# Patient Record
Sex: Female | Born: 1980 | Hispanic: No | Marital: Single | Smoking: Never smoker
Health system: Southern US, Community
[De-identification: ages and names within clinical notes are randomized; demographics above are authoritative.]

## PROBLEM LIST (undated history)

## (undated) ENCOUNTER — Inpatient Hospital Stay (HOSPITAL_COMMUNITY): Payer: Self-pay

## (undated) DIAGNOSIS — R87629 Unspecified abnormal cytological findings in specimens from vagina: Secondary | ICD-10-CM

## (undated) DIAGNOSIS — O480 Post-term pregnancy: Principal | ICD-10-CM

## (undated) DIAGNOSIS — Z98891 History of uterine scar from previous surgery: Secondary | ICD-10-CM

## (undated) DIAGNOSIS — O1495 Unspecified pre-eclampsia, complicating the puerperium: Secondary | ICD-10-CM

## (undated) DIAGNOSIS — Z789 Other specified health status: Secondary | ICD-10-CM

## (undated) HISTORY — DX: Unspecified abnormal cytological findings in specimens from vagina: R87.629

## (undated) HISTORY — PX: DILATION AND CURETTAGE OF UTERUS: SHX78

## (undated) HISTORY — PX: CERVICAL BIOPSY: SHX590

---

## 2007-11-16 ENCOUNTER — Other Ambulatory Visit (HOSPITAL_COMMUNITY): Admission: RE | Admit: 2007-11-16 | Discharge: 2007-12-01 | Payer: Self-pay | Admitting: Psychiatry

## 2007-11-17 ENCOUNTER — Ambulatory Visit: Payer: Self-pay | Admitting: Psychiatry

## 2011-01-19 ENCOUNTER — Emergency Department (HOSPITAL_COMMUNITY)
Admission: EM | Admit: 2011-01-19 | Discharge: 2011-01-20 | Disposition: A | Discharge status: Home / Self Care | Payer: Self-pay | Attending: Emergency Medicine | Admitting: Emergency Medicine

## 2011-01-19 DIAGNOSIS — R21 Rash and other nonspecific skin eruption: Secondary | ICD-10-CM | POA: Insufficient documentation

## 2011-01-19 DIAGNOSIS — I10 Essential (primary) hypertension: Secondary | ICD-10-CM | POA: Insufficient documentation

## 2011-01-19 DIAGNOSIS — L2989 Other pruritus: Secondary | ICD-10-CM | POA: Insufficient documentation

## 2011-01-19 DIAGNOSIS — L298 Other pruritus: Secondary | ICD-10-CM | POA: Insufficient documentation

## 2011-01-19 DIAGNOSIS — L509 Urticaria, unspecified: Secondary | ICD-10-CM | POA: Insufficient documentation

## 2011-01-19 DIAGNOSIS — T7840XA Allergy, unspecified, initial encounter: Secondary | ICD-10-CM | POA: Insufficient documentation

## 2011-06-08 LAB — URINE DRUGS OF ABUSE SCREEN W ALC, ROUTINE (REF LAB)
Barbiturate Quant, Ur: NEGATIVE
Benzodiazepines.: NEGATIVE
Methadone: NEGATIVE
Phencyclidine (PCP): NEGATIVE

## 2015-03-25 ENCOUNTER — Inpatient Hospital Stay (HOSPITAL_COMMUNITY)
Admission: EM | Admit: 2015-03-25 | Discharge: 2015-03-25 | Disposition: A | Discharge status: Home / Self Care | Payer: Medicaid Other | Source: Ambulatory Visit | Attending: Obstetrics & Gynecology | Admitting: Obstetrics & Gynecology

## 2015-03-25 ENCOUNTER — Inpatient Hospital Stay (HOSPITAL_COMMUNITY): Payer: Medicaid Other

## 2015-03-25 ENCOUNTER — Encounter (HOSPITAL_COMMUNITY): Payer: Self-pay | Admitting: *Deleted

## 2015-03-25 DIAGNOSIS — O020 Blighted ovum and nonhydatidiform mole: Secondary | ICD-10-CM | POA: Insufficient documentation

## 2015-03-25 DIAGNOSIS — R1030 Lower abdominal pain, unspecified: Secondary | ICD-10-CM | POA: Diagnosis present

## 2015-03-25 DIAGNOSIS — N760 Acute vaginitis: Secondary | ICD-10-CM

## 2015-03-25 DIAGNOSIS — Z3A14 14 weeks gestation of pregnancy: Secondary | ICD-10-CM | POA: Diagnosis not present

## 2015-03-25 DIAGNOSIS — O080 Genital tract and pelvic infection following ectopic and molar pregnancy: Secondary | ICD-10-CM | POA: Insufficient documentation

## 2015-03-25 DIAGNOSIS — O209 Hemorrhage in early pregnancy, unspecified: Secondary | ICD-10-CM

## 2015-03-25 DIAGNOSIS — O26899 Other specified pregnancy related conditions, unspecified trimester: Secondary | ICD-10-CM

## 2015-03-25 DIAGNOSIS — R109 Unspecified abdominal pain: Secondary | ICD-10-CM

## 2015-03-25 DIAGNOSIS — B9689 Other specified bacterial agents as the cause of diseases classified elsewhere: Secondary | ICD-10-CM

## 2015-03-25 HISTORY — DX: Other specified health status: Z78.9

## 2015-03-25 LAB — URINALYSIS, ROUTINE W REFLEX MICROSCOPIC
Bilirubin Urine: NEGATIVE
Glucose, UA: NEGATIVE mg/dL
Ketones, ur: NEGATIVE mg/dL
LEUKOCYTES UA: NEGATIVE
NITRITE: NEGATIVE
PH: 5.5 (ref 5.0–8.0)
PROTEIN: NEGATIVE mg/dL
SPECIFIC GRAVITY, URINE: 1.025 (ref 1.005–1.030)
Urobilinogen, UA: 0.2 mg/dL (ref 0.0–1.0)

## 2015-03-25 LAB — CBC
HCT: 38.2 % (ref 36.0–46.0)
Hemoglobin: 12.7 g/dL (ref 12.0–15.0)
MCH: 29.7 pg (ref 26.0–34.0)
MCHC: 33.2 g/dL (ref 30.0–36.0)
MCV: 89.3 fL (ref 78.0–100.0)
PLATELETS: 262 10*3/uL (ref 150–400)
RBC: 4.28 MIL/uL (ref 3.87–5.11)
RDW: 13.6 % (ref 11.5–15.5)
WBC: 7.2 10*3/uL (ref 4.0–10.5)

## 2015-03-25 LAB — WET PREP, GENITAL
TRICH WET PREP: NONE SEEN
YEAST WET PREP: NONE SEEN

## 2015-03-25 LAB — URINE MICROSCOPIC-ADD ON

## 2015-03-25 LAB — POCT PREGNANCY, URINE: PREG TEST UR: POSITIVE — AB

## 2015-03-25 LAB — ABO/RH: ABO/RH(D): B POS

## 2015-03-25 LAB — HCG, QUANTITATIVE, PREGNANCY: hCG, Beta Chain, Quant, S: 3368 m[IU]/mL — ABNORMAL HIGH (ref <5)

## 2015-03-25 MED ORDER — MISOPROSTOL 200 MCG PO TABS: 800.0000 ug | ORAL_TABLET | Freq: Once | ORAL | Status: DC

## 2015-03-25 MED ORDER — IBUPROFEN 600 MG PO TABS: 600.0000 mg | ORAL_TABLET | Freq: Four times a day (QID) | ORAL | Status: DC | PRN

## 2015-03-25 MED ORDER — CLINDAMYCIN HCL 300 MG PO CAPS
300.0000 mg | ORAL_CAPSULE | Freq: Two times a day (BID) | ORAL | Status: DC
Start: 2015-03-25 — End: 2016-12-22

## 2015-03-25 MED ORDER — PROMETHAZINE HCL 12.5 MG PO TABS: 12.5000 mg | ORAL_TABLET | Freq: Four times a day (QID) | ORAL | Status: DC | PRN

## 2015-03-25 MED ORDER — IBUPROFEN 600 MG PO TABS
600.0000 mg | ORAL_TABLET | Freq: Once | ORAL | Status: AC
  Administered 2015-03-25: 600 mg via ORAL
  Filled 2015-03-25: qty 1

## 2015-03-25 MED ORDER — HYDROCODONE-ACETAMINOPHEN 5-325 MG PO TABS: 1.0000 | ORAL_TABLET | ORAL | Status: DC | PRN

## 2015-03-25 NOTE — Discharge Instructions (Signed)
Blighted Ovum A blighted ovum (anembryonic pregnancy) happens when a fertilized egg (embryo) attaches itself to the uterine wall, but the embryo does not develop. The pregnancy sac (placenta) continues to grow even though the embryo does not grow and develop.The pregnancy hormone is still secreted because the placenta has formed. This will result in a positive pregnancy test despite having an abnormal pregnancy. A blighted ovum occurs within the first trimester, sometimes before a woman knows she is pregnant.  CAUSES A blighted ovum is usually the result of chromosomal problems. This can be caused by abnormal cell division or poor quality sperm or egg. SYMPTOMS Early on, signs of pregnancy may be experienced, such as:  A missed menstrual period.  Fatigue.  Feeling sick to your stomach (nauseous).  Sore breasts.  A positive pregnancy test. Then, signs of miscarriage may develop, such as:  Abdominal cramps.  Vaginal bleeding or spotting.  A menstrual period that is heavier than usual. DIAGNOSIS The diagnosis of a blighted ovum is made with an ultrasound test that shows an empty uterus or an empty gestational sac. TREATMENT Your caregiver will help you decide what the best treatment is for you. Treatment for a blighted ovum includes:   Letting your body naturally pass the tissue of a blighted ovum.  Taking medicine to trigger the miscarriage.  Having a procedure called a dilation and curettage (D&C) to remove the placental tissues.  A D&C may be helpful if you would like the tissue examined to determine the reason for a miscarriage. Talk to your caregiver about the risks involved with this procedure. HOME CARE INSTRUCTIONS   Follow up with your caregiver to make sure that your pregnancy hormone returns to zero.  Wait at least 1 to 3 regular menstrual cycles before trying to get pregnant again, or as recommended by your caregiver. SEEK IMMEDIATE MEDICAL CARE IF:  You have  worsening abdominal pain.  You have very heavy bleeding or use 1 to 2 pads every hour, for more than 2 hours.  You are dizzy, feel faint, or pass out. Document Released: 12/16/2010 Document Revised: 11/23/2011 Document Reviewed: 12/16/2010 ExitCare Patient Information 2015 [employer]. This information is not intended to replace advice given to you by your health care provider. Make sure you discuss any questions you have with your health care provider.  

## 2015-03-25 NOTE — MAU Provider Note (Signed)
History     CSN: [id number]  Arrival date and time: 03/25/15 1024   First Provider Initiated Contact with Patient 03/25/15 1115      Chief Complaint  Patient presents with  . Vaginal Bleeding  . Abdominal Pain   HPI    34 yo AA female is a G3P0020 with an EDC 09/21/2015 at 14wk2d gestation determined by LMP who presents with a 1 day history of bleeding after using the restroom and lower abdominal pain. Patient first noticed blood on the toilet paper after urinating yesterday and "wiping in the front". Later on that night she had a bowel movement and noticed blood in the toilet and on the toilet paper after "wiping in the back." Blood is thin and light pink in color without noticeable clots and "smells like blood, like after starting a period." Has felt constipated for the last 2-3 days and has strained with bowel movements.   Has 8/10 crampy left-sided lower back pain and 5/10 crampy lower abdominal pain. Pain comes and goes-worsened when laying down to sleep and not worsened with bowel movements. Has taken no medicines. No change in appetite, nausea, vomiting, dysuria or polyuria. No history of UTIs.   Had a cold last week with minor congestion and cough that is resolving.   LMP 12/18/2014. Had 1 day of vaginal bleeding with dark red blood on 01/26/2015 that resolved. No previous problems with pregnancy besides occasional minor nausea and dizziness. Prenatal care to be done at [facility]. Pregnancy is desired.    OB History    Gravida Para Term Preterm AB TAB SAB Ectopic Multiple Living   3    2 2     0      Past Medical History  Diagnosis Date  . Medical history non-contributory     Past Surgical History  Procedure Laterality Date  . Dilation and curettage of uterus    . Cervical biopsy      History reviewed. No pertinent family history.  History  Substance Use Topics  . Smoking status: Never Smoker   . Smokeless tobacco: Not on file  . Alcohol Use: Yes      Comment: socially    Allergies:  Allergies  Allergen Reactions  . Metronidazole Hives    No prescriptions prior to admission    Review of Systems  Constitutional: Negative for fever and chills.  HENT: Negative for congestion and sore throat.   Respiratory: Negative for cough and shortness of breath.   Cardiovascular: Negative for chest pain and leg swelling.  Gastrointestinal: Positive for abdominal pain, constipation and blood in stool. Negative for nausea, vomiting and diarrhea.  Genitourinary: Positive for hematuria (on toilet paper). Negative for dysuria, urgency and frequency.  Musculoskeletal: Positive for back pain (left lower back).  Skin: Positive for rash ("broke out on my face, but it's getting better").  Neurological: Negative for dizziness, loss of consciousness and headaches.   Physical Exam   Blood pressure 112/73, pulse 83, temperature 98.4 F (36.9 C), temperature source Oral, resp. rate 16, height 5\' 2"  (1.575 m), weight 81.285 kg (179 lb 3.2 oz), last menstrual period 12/15/2014.  Physical Exam  Constitutional: She is oriented to person, place, and time. She appears well-developed and well-nourished.  HENT:  Head: Normocephalic and atraumatic.  Cardiovascular: Normal rate, regular rhythm and normal heart sounds.   Respiratory: Effort normal and breath sounds normal.  GI: Soft. Bowel sounds are normal. She exhibits no distension. There is no tenderness.  Genitourinary:  Speculum  exam: Vagina - small-moderate amount of dark red blood noted in the vaginal canal. One fox swab used to clear.  Cervix -+ active bleeding, small clot at the cervix.  Bimanual exam: Cervix slightly open  Uterus non tender, enlarged  Adnexa non tender, no masses bilaterally GC/Chlam, wet prep done Chaperone present for exam.  Neurological: She is alert and oriented to person, place, and time.  Skin: Skin is warm and dry.  Psychiatric: She has a normal mood and affect.   Results  for orders placed or performed during the hospital encounter of 03/25/15 (from the past 24 hour(s))  Urinalysis, Routine w reflex microscopic (not at [facility])     Status: Abnormal   Collection Time: 03/25/15 10:42 AM  Result Value Ref Range   Color, Urine YELLOW YELLOW   APPearance CLEAR CLEAR   Specific Gravity, Urine 1.025 1.005 - 1.030   pH 5.5 5.0 - 8.0   Glucose, UA NEGATIVE NEGATIVE mg/dL   Hgb urine dipstick LARGE (A) NEGATIVE   Bilirubin Urine NEGATIVE NEGATIVE   Ketones, ur NEGATIVE NEGATIVE mg/dL   Protein, ur NEGATIVE NEGATIVE mg/dL   Urobilinogen, UA 0.2 0.0 - 1.0 mg/dL   Nitrite NEGATIVE NEGATIVE   Leukocytes, UA NEGATIVE NEGATIVE  Urine microscopic-add on     Status: Abnormal   Collection Time: 03/25/15 10:42 AM  Result Value Ref Range   Squamous Epithelial / LPF FEW (A) RARE   RBC / HPF 0-2 <3 RBC/hpf   Bacteria, UA RARE RARE  Pregnancy, urine POC     Status: Abnormal   Collection Time: 03/25/15 10:49 AM  Result Value Ref Range   Preg Test, Ur POSITIVE (A) NEGATIVE  CBC     Status: None   Collection Time: 03/25/15 11:50 AM  Result Value Ref Range   WBC 7.2 4.0 - 10.5 K/uL   RBC 4.28 3.87 - 5.11 MIL/uL   Hemoglobin 12.7 12.0 - 15.0 g/dL   HCT 38.2 36.0 - 46.0 %   MCV 89.3 78.0 - 100.0 fL   MCH 29.7 26.0 - 34.0 pg   MCHC 33.2 30.0 - 36.0 g/dL   RDW 13.6 11.5 - 15.5 %   Platelets 262 150 - 400 K/uL  ABO/Rh     Status: None (Preliminary result)   Collection Time: 03/25/15 11:50 AM  Result Value Ref Range   ABO/RH(D) B POS   hCG, quantitative, pregnancy     Status: Abnormal   Collection Time: 03/25/15 11:50 AM  Result Value Ref Range   hCG, Beta Chain, Quant, S 3368 (H) <5 mIU/mL  Wet prep, genital     Status: Abnormal   Collection Time: 03/25/15 12:59 PM  Result Value Ref Range   Yeast Wet Prep HPF POC NONE SEEN NONE SEEN   Trich, Wet Prep NONE SEEN NONE SEEN   Clue Cells Wet Prep HPF POC MODERATE (A) NONE SEEN   WBC, Wet Prep HPF POC FEW (A) NONE SEEN      US Ob Comp Less 14 Wks  03/25/2015   CLINICAL DATA:  Abdominal pain and cramping.  EXAM: OBSTETRIC <14 WK [place] AND TRANSVAGINAL OB US  TECHNIQUE: Both transabdominal and transvaginal ultrasound examinations were performed for complete evaluation of the gestation as well as the maternal uterus, adnexal regions, and pelvic cul-de-sac. Transvaginal technique was performed to assess early pregnancy.  COMPARISON:  None.  FINDINGS: Intrauterine gestational sac: Single  Yolk sac:  No  Embryo:  No  Cardiac Activity: Not applicable  Heart Rate:  Not applicable  bpm  MSD: 94.5  mm   8 w   4  d  Maternal uterus/adnexae:  Subchorionic hemorrhage: None  Right ovary: Normal  Left ovary: Normal  Other :None  Free fluid:  None  IMPRESSION: 1. 31.9 mm in mean sac diameter corresponds to an 8 week 4 day gestational. No embryo or yolk sac is identified. Findings meet definitive criteria for failed pregnancy. This follows SRU consensus guidelines: Diagnostic Criteria for Nonviable Pregnancy Early in the First Trimester. Alison Stalling J Med [phone].   Electronically Signed   By: Kerby Moors M.D.   On: 03/25/2015 12:54   US Ob Transvaginal  03/25/2015   CLINICAL DATA:  Abdominal pain and cramping.  EXAM: OBSTETRIC <14 WK [place] AND TRANSVAGINAL OB US  TECHNIQUE: Both transabdominal and transvaginal ultrasound examinations were performed for complete evaluation of the gestation as well as the maternal uterus, adnexal regions, and pelvic cul-de-sac. Transvaginal technique was performed to assess early pregnancy.  COMPARISON:  None.  FINDINGS: Intrauterine gestational sac: Single  Yolk sac:  No  Embryo:  No  Cardiac Activity: Not applicable  Heart Rate: Not applicable  bpm  MSD: 79.1  mm   8 w   4  d  Maternal uterus/adnexae:  Subchorionic hemorrhage: None  Right ovary: Normal  Left ovary: Normal  Other :None  Free fluid:  None  IMPRESSION: 1. 31.9 mm in mean sac diameter corresponds to an 8 week 4 day gestational. No embryo or  yolk sac is identified. Findings meet definitive criteria for failed pregnancy. This follows SRU consensus guidelines: Diagnostic Criteria for Nonviable Pregnancy Early in the First Trimester. Alison Stalling J Med [phone].   Electronically Signed   By: Kerby Moors M.D.   On: 03/25/2015 12:54      MAU Course  Procedures  None  MDM -Doppler: unable to auscultate fetal heart tones -UPT positive with large Hgb on urine dipstick and abdominal pain: order CBC, ABO Rh, hCG quant, GC, chlamydia, wet prep, HIV, ultrasound -B positive blood type  -Discussed expectant management, cytotec, D&C. Patient would like to proceed with cytotec. Discussed administration at home/ precautions at length.    Early Intrauterine Pregnancy Failure Protocol X  Documented intrauterine pregnancy failure less than or equal to [redacted] weeks gestation  X  No serious current illness  X  Baseline Hgb greater than or equal to 10g/dl  X  Patient has easily accessible transportation to the hospital  X  Clear preference  X  Practitioner/physician deems patient reliable  X  Counseling by practitioner or physician  X  Patient education by RN  X  Consent form signed   NA    Rho-Gam given by RN if indicated  X  Medication dispensed  X  Cytotec 800 mcg Intravaginally by patient at home with 1 refill to repeat in 48 hours if no onset of heavy bleeding.       Intravaginally by NP in MAU       Rectally by patient at home       Rectally by RN in MAU  X   Ibuprofen 600 mg 1 tablet by mouth every 6 hours as needed #30 - prescribed  X   Vicodin mg by mouth every 4 to 6 hours as needed - #10 prescribed  X   Phenergan 12.5 mg by mouth every 4 hours as needed for nausea - #10 prescribed  Reviewed with pt cytotec procedure.  Pt verbalizes that she lives close  to the hospital and has transportation readily available.  Pt appears reliable and verbalizes understanding and agrees with plan of care  Assessment and Plan    A:  1.  Blighted ovum   2. Abdominal pain in pregnancy, antepartum   3. Vaginal bleeding in pregnancy, first trimester   4. BV (bacterial vaginosis)     P:  Discharge home in stable condition Bleeding precautions Return to MAU if symptoms worsen RX: vicodin, ibuprofen, cytotec with 1 refill, phenergan, clindamycin (patient allergic to metronidazole)  Follow up in the [place] in 1-2 weeks for repeat beta Pelvic rest Support given  Lezlie Lye, NP 03/25/2015 1:59 PM

## 2015-03-25 NOTE — MAU Note (Signed)
Saw blood with wiping yesterday, blood in toilet & on tissue this a.m.  Lower abd cramping & back pain.  [facility].  2 pos HPT's, one a month ago, one last week.

## 2015-03-26 LAB — GC/CHLAMYDIA PROBE AMP (~~LOC~~) NOT AT ARMC
Chlamydia: NEGATIVE
NEISSERIA GONORRHEA: NEGATIVE

## 2015-03-26 LAB — HIV ANTIBODY (ROUTINE TESTING W REFLEX): HIV SCREEN 4TH GENERATION: NONREACTIVE

## 2015-04-08 ENCOUNTER — Other Ambulatory Visit: Payer: Self-pay

## 2015-04-11 ENCOUNTER — Other Ambulatory Visit: Payer: Medicaid Other

## 2015-04-11 DIAGNOSIS — O039 Complete or unspecified spontaneous abortion without complication: Secondary | ICD-10-CM

## 2015-04-12 LAB — HCG, QUANTITATIVE, PREGNANCY: HCG, BETA CHAIN, QUANT, S: 13.2 m[IU]/mL

## 2015-04-15 ENCOUNTER — Encounter: Payer: Self-pay | Admitting: *Deleted

## 2015-04-15 ENCOUNTER — Telehealth: Payer: Self-pay | Admitting: *Deleted

## 2015-04-15 NOTE — Telephone Encounter (Signed)
Attempted to contact patient, no answer, will send certified letter.  Certified letter sent.

## 2015-04-15 NOTE — Telephone Encounter (Signed)
-----   Message from Woodroe Mode, MD sent at 04/14/2015  3:45 PM EDT ----- Drop in HCG c/w complete miscarriage

## 2015-11-18 ENCOUNTER — Ambulatory Visit (INDEPENDENT_AMBULATORY_CARE_PROVIDER_SITE_OTHER): Payer: Medicaid Other | Admitting: Obstetrics and Gynecology

## 2015-11-18 ENCOUNTER — Other Ambulatory Visit (HOSPITAL_COMMUNITY)
Admission: RE | Admit: 2015-11-18 | Discharge: 2015-11-18 | Disposition: A | Discharge status: Home / Self Care | Payer: Medicaid Other | Source: Ambulatory Visit | Attending: Obstetrics and Gynecology | Admitting: Obstetrics and Gynecology

## 2015-11-18 ENCOUNTER — Encounter: Payer: Self-pay | Admitting: Obstetrics and Gynecology

## 2015-11-18 VITALS — BP 115/63 | HR 71 | Temp 98.5°F | Wt 174.1 lb

## 2015-11-18 DIAGNOSIS — Z124 Encounter for screening for malignant neoplasm of cervix: Secondary | ICD-10-CM

## 2015-11-18 DIAGNOSIS — Z1151 Encounter for screening for human papillomavirus (HPV): Secondary | ICD-10-CM | POA: Diagnosis not present

## 2015-11-18 DIAGNOSIS — Z01419 Encounter for gynecological examination (general) (routine) without abnormal findings: Secondary | ICD-10-CM | POA: Diagnosis not present

## 2015-11-18 DIAGNOSIS — Z0000 Encounter for general adult medical examination without abnormal findings: Secondary | ICD-10-CM | POA: Diagnosis not present

## 2015-11-18 DIAGNOSIS — Z3202 Encounter for pregnancy test, result negative: Secondary | ICD-10-CM | POA: Diagnosis not present

## 2015-11-18 DIAGNOSIS — N926 Irregular menstruation, unspecified: Secondary | ICD-10-CM

## 2015-11-18 LAB — POCT PREGNANCY, URINE: PREG TEST UR: NEGATIVE

## 2015-11-18 MED ORDER — PRENATAL PLUS 27-1 MG PO TABS: 1.0000 | ORAL_TABLET | Freq: Every day | ORAL | Status: DC

## 2015-11-18 NOTE — Progress Notes (Addendum)
Subjective:     Patient ID: Crystal Hudson, female   DOB: September 02, 1981, 35 y.o.   MRN: [id number]  HPI G4 P0020 here for well-woman exam and desire to conceive. Had  Early EAB x2 (1 with D&E)  followed by cytotec for EPF in July 2016. Began trying to conceive in October. Ms. Hx [phone]. LNMP Dec 24-27. Short duration cycle Jan 30-31, light flow. Also started working FT in Jan. Gets primary care at [facility]. History of abnormal Pap in the  past with follow-ups normal. Last was 2 years ago.    Review of Systems Concern with R>L shoulder discomfort and pain especially with exercise attributed to her large breasts. Has tried specially fitted bras and sports bras with little relief. ROS otherwise negative.     Past Medical History  Diagnosis Date  . Medical history non-contributory    Past Surgical History  Procedure Laterality Date  . Dilation and curettage of uterus    . Cervical biopsy        Objective:   Physical Exam  Constitutional: She is oriented to person, place, and time. She appears well-developed and well-nourished.  HENT:  Head: Normocephalic.  Eyes: Pupils are equal, round, and reactive to light.  Neck: Normal range of motion. No thyromegaly present.  Cardiovascular: Normal rate and regular rhythm.   Pulmonary/Chest: Effort normal and breath sounds normal.  Abdominal: Soft. There is no tenderness.  Genitourinary: Vagina normal and uterus normal. No vaginal discharge found.  Musculoskeletal: Normal range of motion.  Neurological: She is alert and oriented to person, place, and time.  Skin: Skin is warm and dry.  Nursing note and vitals reviewed. Breasts are pendulous      Assessment:     1. Well woman exam with routine gynecological exam   2. Irregular menses       Plan:   Pap and HIV sent Advised trying different supportive bras and avoid exercises causing discomfort Reassurance that single abnormal menstrual period not significant Counseled: healthy diet May  try basal body temperature regiment if desires Repeat HPT 1 week idf still late menses  Rx PNVs 1/d  Return prn

## 2015-11-18 NOTE — Patient Instructions (Signed)
Health Maintenance, Female Adopting a healthy lifestyle and getting preventive care can go a long way to promote health and wellness. Talk with your health care provider about what schedule of regular examinations is right for you. This is a good chance for you to check in with your provider about disease prevention and staying healthy. In between checkups, there are plenty of things you can do on your own. Experts have done a lot of research about which lifestyle changes and preventive measures are most likely to keep you healthy. Ask your health care provider for more information. WEIGHT AND DIET  Eat a healthy diet  Be sure to include plenty of vegetables, fruits, low-fat dairy products, and lean protein.  Do not eat a lot of foods high in solid fats, added sugars, or salt.  Get regular exercise. This is one of the most important things you can do for your health.  Most adults should exercise for at least 150 minutes each week. The exercise should increase your heart rate and make you sweat (moderate-intensity exercise).  Most adults should also do strengthening exercises at least twice a week. This is in addition to the moderate-intensity exercise.  Maintain a healthy weight  Body mass index (BMI) is a measurement that can be used to identify possible weight problems. It estimates body fat based on height and weight. Your health care provider can help determine your BMI and help you achieve or maintain a healthy weight.  For females 20 years of age and older:   A BMI below 18.5 is considered underweight.  A BMI of 18.5 to 24.9 is normal.  A BMI of 25 to 29.9 is considered overweight.  A BMI of 30 and above is considered obese.  Watch levels of cholesterol and blood lipids  You should start having your blood tested for lipids and cholesterol at 34 years of age, then have this test every 5 years.  You may need to have your cholesterol levels checked more often if:  Your lipid  or cholesterol levels are high.  You are older than 34 years of age.  You are at high risk for heart disease.  CANCER SCREENING   Lung Cancer  Lung cancer screening is recommended for adults 55-80 years old who are at high risk for lung cancer because of a history of smoking.  A yearly low-dose CT scan of the lungs is recommended for people who:  Currently smoke.  Have quit within the past 15 years.  Have at least a 30-pack-year history of smoking. A pack year is smoking an average of one pack of cigarettes a day for 1 year.  Yearly screening should continue until it has been 15 years since you quit.  Yearly screening should stop if you develop a health problem that would prevent you from having lung cancer treatment.  Breast Cancer  Practice breast self-awareness. This means understanding how your breasts normally appear and feel.  It also means doing regular breast self-exams. Let your health care provider know about any changes, no matter how small.  If you are in your 20s or 30s, you should have a clinical breast exam (CBE) by a health care provider every 1-3 years as part of a regular health exam.  If you are 40 or older, have a CBE every year. Also consider having a breast X-ray (mammogram) every year.  If you have a family history of breast cancer, talk to your health care provider about genetic screening.  If you   are at high risk for breast cancer, talk to your health care provider about having an MRI and a mammogram every year.  Breast cancer gene (BRCA) assessment is recommended for women who have family members with BRCA-related cancers. BRCA-related cancers include:  Breast.  Ovarian.  Tubal.  Peritoneal cancers.  Results of the assessment will determine the need for genetic counseling and BRCA1 and BRCA2 testing. Cervical Cancer Your health care provider may recommend that you be screened regularly for cancer of the pelvic organs (ovaries, uterus, and  vagina). This screening involves a pelvic examination, including checking for microscopic changes to the surface of your cervix (Pap test). You may be encouraged to have this screening done every 3 years, beginning at age 21.  For women ages 30-65, health care providers may recommend pelvic exams and Pap testing every 3 years, or they may recommend the Pap and pelvic exam, combined with testing for human papilloma virus (HPV), every 5 years. Some types of HPV increase your risk of cervical cancer. Testing for HPV may also be done on women of any age with unclear Pap test results.  Other health care providers may not recommend any screening for nonpregnant women who are considered low risk for pelvic cancer and who do not have symptoms. Ask your health care provider if a screening pelvic exam is right for you.  If you have had past treatment for cervical cancer or a condition that could lead to cancer, you need Pap tests and screening for cancer for at least 20 years after your treatment. If Pap tests have been discontinued, your risk factors (such as having a new sexual partner) need to be reassessed to determine if screening should resume. Some women have medical problems that increase the chance of getting cervical cancer. In these cases, your health care provider may recommend more frequent screening and Pap tests. Colorectal Cancer  This type of cancer can be detected and often prevented.  Routine colorectal cancer screening usually begins at 34 years of age and continues through 34 years of age.  Your health care provider may recommend screening at an earlier age if you have risk factors for colon cancer.  Your health care provider may also recommend using home test kits to check for hidden blood in the stool.  A small camera at the end of a tube can be used to examine your colon directly (sigmoidoscopy or colonoscopy). This is done to check for the earliest forms of colorectal  cancer.  Routine screening usually begins at age 50.  Direct examination of the colon should be repeated every 5-10 years through 34 years of age. However, you may need to be screened more often if early forms of precancerous polyps or small growths are found. Skin Cancer  Check your skin from head to toe regularly.  Tell your health care provider about any new moles or changes in moles, especially if there is a change in a mole's shape or color.  Also tell your health care provider if you have a mole that is larger than the size of a pencil eraser.  Always use sunscreen. Apply sunscreen liberally and repeatedly throughout the day.  Protect yourself by wearing long sleeves, pants, a wide-brimmed hat, and sunglasses whenever you are outside. HEART DISEASE, DIABETES, AND HIGH BLOOD PRESSURE   High blood pressure causes heart disease and increases the risk of stroke. High blood pressure is more likely to develop in:  People who have blood pressure in the high end   of the normal range (130-139/85-89 mm Hg).  People who are overweight or obese.  People who are African American.  If you are 38-23 years of age, have your blood pressure checked every 3-5 years. If you are 61 years of age or older, have your blood pressure checked every year. You should have your blood pressure measured twice--once when you are at a hospital or clinic, and once when you are not at a hospital or clinic. Record the average of the two measurements. To check your blood pressure when you are not at a hospital or clinic, you can use:  An automated blood pressure machine at a pharmacy.  A home blood pressure monitor.  If you are between 45 years and 39 years old, ask your health care provider if you should take aspirin to prevent strokes.  Have regular diabetes screenings. This involves taking a blood sample to check your fasting blood sugar level.  If you are at a normal weight and have a low risk for diabetes,  have this test once every three years after 34 years of age.  If you are overweight and have a high risk for diabetes, consider being tested at a younger age or more often. PREVENTING INFECTION  Hepatitis B  If you have a higher risk for hepatitis B, you should be screened for this virus. You are considered at high risk for hepatitis B if:  You were born in a country where hepatitis B is common. Ask your health care provider which countries are considered high risk.  Your parents were born in a high-risk country, and you have not been immunized against hepatitis B (hepatitis B vaccine).  You have HIV or AIDS.  You use needles to inject street drugs.  You live with someone who has hepatitis B.  You have had sex with someone who has hepatitis B.  You get hemodialysis treatment.  You take certain medicines for conditions, including cancer, organ transplantation, and autoimmune conditions. Hepatitis C  Blood testing is recommended for:  Everyone born from [birth year] through 1964.  Anyone with known risk factors for hepatitis C. Sexually transmitted infections (STIs)  You should be screened for sexually transmitted infections (STIs) including gonorrhea and chlamydia if:  You are sexually active and are younger than 34 years of age.  You are older than 34 years of age and your health care provider tells you that you are at risk for this type of infection.  Your sexual activity has changed since you were last screened and you are at an increased risk for chlamydia or gonorrhea. Ask your health care provider if you are at risk.  If you do not have HIV, but are at risk, it may be recommended that you take a prescription medicine daily to prevent HIV infection. This is called pre-exposure prophylaxis (PrEP). You are considered at risk if:  You are sexually active and do not regularly use condoms or know the HIV status of your partner(s).  You take drugs by injection.  You are sexually  active with a partner who has HIV. Talk with your health care provider about whether you are at high risk of being infected with HIV. If you choose to begin PrEP, you should first be tested for HIV. You should then be tested every 3 months for as long as you are taking PrEP.  PREGNANCY   If you are premenopausal and you may become pregnant, ask your health care provider about preconception counseling.  If you may  become pregnant, take 400 to 800 micrograms (mcg) of folic acid every day.  If you want to prevent pregnancy, talk to your health care provider about birth control (contraception). OSTEOPOROSIS AND MENOPAUSE   Osteoporosis is a disease in which the bones lose minerals and strength with aging. This can result in serious bone fractures. Your risk for osteoporosis can be identified using a bone density scan.  If you are 61 years of age or older, or if you are at risk for osteoporosis and fractures, ask your health care provider if you should be screened.  Ask your health care provider whether you should take a calcium or vitamin D supplement to lower your risk for osteoporosis.  Menopause may have certain physical symptoms and risks.  Hormone replacement therapy may reduce some of these symptoms and risks. Talk to your health care provider about whether hormone replacement therapy is right for you.  HOME CARE INSTRUCTIONS   Schedule regular health, dental, and eye exams.  Stay current with your immunizations.   Do not use any tobacco products including cigarettes, chewing tobacco, or electronic cigarettes.  If you are pregnant, do not drink alcohol.  If you are breastfeeding, limit how much and how often you drink alcohol.  Limit alcohol intake to no more than 1 drink per day for nonpregnant women. One drink equals 12 ounces of beer, 5 ounces of wine, or 1 ounces of hard liquor.  Do not use street drugs.  Do not share needles.  Ask your health care provider for help if  you need support or information about quitting drugs.  Tell your health care provider if you often feel depressed.  Tell your health care provider if you have ever been abused or do not feel safe at home.   This information is not intended to replace advice given to you by your health care provider. Make sure you discuss any questions you have with your health care provider.   Document Released: 03/16/2011 Document Revised: 09/21/2014 Document Reviewed: 08/02/2013 Elsevier Interactive Patient Education [employer].

## 2015-11-19 LAB — HIV ANTIBODY (ROUTINE TESTING W REFLEX): HIV: NONREACTIVE

## 2015-11-19 LAB — CYTOLOGY - PAP

## 2016-05-21 LAB — OB RESULTS CONSOLE ANTIBODY SCREEN: ANTIBODY SCREEN: NEGATIVE

## 2016-05-21 LAB — OB RESULTS CONSOLE RPR: RPR: NONREACTIVE

## 2016-05-21 LAB — OB RESULTS CONSOLE ABO/RH: RH TYPE: POSITIVE

## 2016-05-21 LAB — OB RESULTS CONSOLE GC/CHLAMYDIA
Chlamydia: NEGATIVE
Gonorrhea: NEGATIVE

## 2016-05-21 LAB — OB RESULTS CONSOLE HEPATITIS B SURFACE ANTIGEN: Hepatitis B Surface Ag: NEGATIVE

## 2016-05-21 LAB — OB RESULTS CONSOLE RUBELLA ANTIBODY, IGM: Rubella: IMMUNE

## 2016-05-21 LAB — OB RESULTS CONSOLE HIV ANTIBODY (ROUTINE TESTING): HIV: NONREACTIVE

## 2016-11-19 LAB — OB RESULTS CONSOLE GBS: STREP GROUP B AG: NEGATIVE

## 2016-12-21 ENCOUNTER — Encounter (HOSPITAL_COMMUNITY): Payer: Self-pay | Admitting: *Deleted

## 2016-12-21 ENCOUNTER — Telehealth (HOSPITAL_COMMUNITY): Payer: Self-pay | Admitting: *Deleted

## 2016-12-21 ENCOUNTER — Encounter (HOSPITAL_COMMUNITY): Payer: Self-pay

## 2016-12-21 DIAGNOSIS — O480 Post-term pregnancy: Secondary | ICD-10-CM

## 2016-12-21 HISTORY — DX: Post-term pregnancy: O48.0

## 2016-12-21 NOTE — H&P (Signed)
Crystal Hudson is a 36 y.o. female G1P0 at 7+ for IOL - given 40+ week and elevated BP.  Relatively uncomplicated PNC until elevated B in last several weeks.  D/W pt r/b/a of IOL and process.  Received flu 10/25 and Tdap 1/11.  Pt is also AMA.   OB History    Gravida Para Term Preterm AB Living   3       2 0   SAB TAB Ectopic Multiple Live Births     2          TAB, SAB G3 present + STD Pap 3/17 WNL  Past Medical History:  Diagnosis Date  . Medical history non-contributory   . Post term pregnancy over 40 weeks 12/21/2016  . Vaginal Pap smear, abnormal    Past Surgical History:  Procedure Laterality Date  . CERVICAL BIOPSY    . DILATION AND CURETTAGE OF UTERUS     Family History: family history includes Diabetes in her father; Hypertension in her father, maternal aunt, and maternal uncle. Social History:  reports that she has never smoked. She has never used smokeless tobacco. She reports that she drinks alcohol. She reports that she uses drugs, including Marijuana. 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   HENT: Negative.   Eyes: Negative.   Respiratory: Negative.   Cardiovascular: Negative.   Gastrointestinal: Negative.   Genitourinary: Negative.   Musculoskeletal: Positive for back pain.  Skin: Negative.   Neurological: Negative.   Psychiatric/Behavioral: Negative.    Maternal Medical History:  Contractions: Frequency: irregular.   Perceived severity is moderate.    Fetal activity: Perceived fetal activity is normal.    Prenatal complications:  PIH.   Prenatal Complications - Diabetes: none.      Last menstrual period 03/14/2016, unknown if currently breastfeeding. Maternal Exam:  Uterine Assessment: Contraction frequency is irregular.   Abdomen: Fundal height is 42+.   Estimated fetal weight is 8.5-9.5#.   Fetal presentation: vertex  Introitus: Normal vulva. Normal vagina.  Cervix: Cervix evaluated by digital exam.     Physical Exam  Constitutional: She is oriented to person, place, and time. She appears well-developed.  HENT:  Head: Normocephalic and atraumatic.  Cardiovascular: Normal rate and regular rhythm.   Respiratory: Effort normal and breath sounds normal. No respiratory distress. She has no wheezes.  GI: Soft. Bowel sounds are normal. She exhibits no distension. There is tenderness.  Musculoskeletal: Normal range of motion.  Neurological: She is alert and oriented to person, place, and time.  Skin: Skin is warm and dry.  Psychiatric: She has a normal mood and affect. Her behavior is normal.    Prenatal labs: ABO, Rh: B/Positive/-- (09/07 0000) Antibody: Negative (09/07 0000) Rubella: Immune (09/07 0000) RPR: Nonreactive (09/07 0000)  HBsAg: Negative (09/07 0000)  HIV: Non-reactive (09/07 0000)  GBS: Negative (03/08 0000)   Flu 10/25, Tdap 1/11  Hgb 12.3/Plt 295/Ur Cx neg/GC neg/Chl neg/Varicella immune/nl Hgb electro/ glucola 139 - nl 3 hr GTT  Nl first tri screen, nl NT Neg essential panel [place] nl anat, ant plac, female  Paid for circ  in office Assessment/Plan: 36yo G3P0020 at 40+ for IOL cytotec and pitocin for IOL Epidural prn Expect SVD

## 2016-12-21 NOTE — H&P (Deleted)
  The note originally documented on this encounter has been moved the the encounter in which it belongs.  

## 2016-12-21 NOTE — Telephone Encounter (Signed)
Preadmission screen  

## 2016-12-22 ENCOUNTER — Inpatient Hospital Stay (HOSPITAL_COMMUNITY): Payer: Medicaid Other | Admitting: Anesthesiology

## 2016-12-22 ENCOUNTER — Encounter (HOSPITAL_COMMUNITY)
Admission: RE | Disposition: A | Discharge status: Home / Self Care | Payer: Self-pay | Source: Ambulatory Visit | Attending: Obstetrics and Gynecology

## 2016-12-22 ENCOUNTER — Inpatient Hospital Stay (HOSPITAL_COMMUNITY)
Admission: RE | Admit: 2016-12-22 | Discharge: 2016-12-22 | Disposition: A | Discharge status: Home / Self Care | Payer: Medicaid Other | Source: Ambulatory Visit | Attending: Obstetrics and Gynecology | Admitting: Obstetrics and Gynecology

## 2016-12-22 ENCOUNTER — Inpatient Hospital Stay (HOSPITAL_COMMUNITY)
Admission: RE | Admit: 2016-12-22 | Discharge: 2016-12-25 | DRG: 765 | Disposition: A | Discharge status: Home / Self Care | Payer: Medicaid Other | Source: Ambulatory Visit | Attending: Obstetrics and Gynecology | Admitting: Obstetrics and Gynecology

## 2016-12-22 ENCOUNTER — Encounter (HOSPITAL_COMMUNITY): Payer: Self-pay

## 2016-12-22 DIAGNOSIS — O480 Post-term pregnancy: Secondary | ICD-10-CM

## 2016-12-22 DIAGNOSIS — Z3A40 40 weeks gestation of pregnancy: Secondary | ICD-10-CM | POA: Diagnosis not present

## 2016-12-22 DIAGNOSIS — O99324 Drug use complicating childbirth: Secondary | ICD-10-CM | POA: Diagnosis present

## 2016-12-22 DIAGNOSIS — Z3490 Encounter for supervision of normal pregnancy, unspecified, unspecified trimester: Secondary | ICD-10-CM

## 2016-12-22 DIAGNOSIS — D252 Subserosal leiomyoma of uterus: Secondary | ICD-10-CM | POA: Diagnosis present

## 2016-12-22 DIAGNOSIS — F1210 Cannabis abuse, uncomplicated: Secondary | ICD-10-CM | POA: Diagnosis present

## 2016-12-22 DIAGNOSIS — Z98891 History of uterine scar from previous surgery: Secondary | ICD-10-CM

## 2016-12-22 DIAGNOSIS — Z3493 Encounter for supervision of normal pregnancy, unspecified, third trimester: Secondary | ICD-10-CM | POA: Diagnosis present

## 2016-12-22 DIAGNOSIS — O3413 Maternal care for benign tumor of corpus uteri, third trimester: Secondary | ICD-10-CM | POA: Diagnosis present

## 2016-12-22 DIAGNOSIS — Z01419 Encounter for gynecological examination (general) (routine) without abnormal findings: Secondary | ICD-10-CM

## 2016-12-22 HISTORY — DX: History of uterine scar from previous surgery: Z98.891

## 2016-12-22 HISTORY — DX: Post-term pregnancy: O48.0

## 2016-12-22 LAB — CBC
HCT: 31 % — ABNORMAL LOW (ref 36.0–46.0)
HCT: 29.6 % — ABNORMAL LOW (ref 36.0–46.0)
HEMOGLOBIN: 9.5 g/dL — AB (ref 12.0–15.0)
Hemoglobin: 9.7 g/dL — ABNORMAL LOW (ref 12.0–15.0)
MCH: 26.1 pg (ref 26.0–34.0)
MCH: 26.8 pg (ref 26.0–34.0)
MCHC: 31.3 g/dL (ref 30.0–36.0)
MCHC: 32.1 g/dL (ref 30.0–36.0)
MCV: 83.6 fL (ref 78.0–100.0)
MCV: 83.6 fL (ref 78.0–100.0)
Platelets: 251 10*3/uL (ref 150–400)
Platelets: 230 10*3/uL (ref 150–400)
RBC: 3.71 MIL/uL — ABNORMAL LOW (ref 3.87–5.11)
RBC: 3.54 MIL/uL — ABNORMAL LOW (ref 3.87–5.11)
RDW: 15.1 % (ref 11.5–15.5)
RDW: 15.2 % (ref 11.5–15.5)
WBC: 8.7 10*3/uL (ref 4.0–10.5)
WBC: 9.9 10*3/uL (ref 4.0–10.5)

## 2016-12-22 LAB — TYPE AND SCREEN
ABO/RH(D): B POS
Antibody Screen: NEGATIVE

## 2016-12-22 LAB — COMPREHENSIVE METABOLIC PANEL
ALBUMIN: 2.8 g/dL — AB (ref 3.5–5.0)
ALK PHOS: 132 U/L — AB (ref 38–126)
ALT: 16 U/L (ref 14–54)
ANION GAP: 7 (ref 5–15)
AST: 20 U/L (ref 15–41)
BILIRUBIN TOTAL: 0.8 mg/dL (ref 0.3–1.2)
BUN: 11 mg/dL (ref 6–20)
CALCIUM: 8.5 mg/dL — AB (ref 8.9–10.3)
CO2: 20 mmol/L — AB (ref 22–32)
CREATININE: 0.68 mg/dL (ref 0.44–1.00)
Chloride: 107 mmol/L (ref 101–111)
GFR calc Af Amer: >60 mL/min (ref >60)
GFR calc non Af Amer: >60 mL/min (ref >60)
GLUCOSE: 111 mg/dL — AB (ref 65–99)
Potassium: 3.5 mmol/L (ref 3.5–5.1)
Sodium: 134 mmol/L — ABNORMAL LOW (ref 135–145)
TOTAL PROTEIN: 6.4 g/dL — AB (ref 6.5–8.1)

## 2016-12-22 LAB — RPR: RPR Ser Ql: NONREACTIVE

## 2016-12-22 SURGERY — Surgical Case
Anesthesia: Epidural | Wound class: Clean Contaminated

## 2016-12-22 MED ORDER — MEPERIDINE HCL 25 MG/ML IJ SOLN
INTRAMUSCULAR | Status: AC
  Filled 2016-12-22: qty 1

## 2016-12-22 MED ORDER — ONDANSETRON HCL 4 MG/2ML IJ SOLN
INTRAMUSCULAR | Status: AC
  Filled 2016-12-22: qty 2

## 2016-12-22 MED ORDER — ONDANSETRON HCL 4 MG/2ML IJ SOLN: 4.0000 mg | Freq: Three times a day (TID) | INTRAMUSCULAR | Status: DC | PRN

## 2016-12-22 MED ORDER — KETOROLAC TROMETHAMINE 30 MG/ML IJ SOLN
INTRAMUSCULAR | Status: AC
  Filled 2016-12-22: qty 1

## 2016-12-22 MED ORDER — MORPHINE SULFATE (PF) 0.5 MG/ML IJ SOLN
INTRAMUSCULAR | Status: DC | PRN
  Administered 2016-12-22: 4 mg via EPIDURAL

## 2016-12-22 MED ORDER — KETOROLAC TROMETHAMINE 30 MG/ML IJ SOLN: 30.0000 mg | Freq: Four times a day (QID) | INTRAMUSCULAR | Status: AC | PRN

## 2016-12-22 MED ORDER — EPHEDRINE 5 MG/ML INJ
10.0000 mg | INTRAVENOUS | Status: DC | PRN
Start: 2016-12-22 — End: 2016-12-23

## 2016-12-22 MED ORDER — LACTATED RINGERS IV SOLN
500.0000 mL | INTRAVENOUS | Status: DC | PRN
  Administered 2016-12-22: 500 mL via INTRAVENOUS

## 2016-12-22 MED ORDER — LACTATED RINGERS IV SOLN
INTRAVENOUS | Status: DC
  Administered 2016-12-22 (×6): via INTRAVENOUS

## 2016-12-22 MED ORDER — LABETALOL HCL 5 MG/ML IV SOLN
20.0000 mg | INTRAVENOUS | Status: DC | PRN
  Administered 2016-12-22: 20 mg via INTRAVENOUS
  Filled 2016-12-22: qty 4

## 2016-12-22 MED ORDER — MEPERIDINE HCL 25 MG/ML IJ SOLN: 6.2500 mg | INTRAMUSCULAR | Status: DC | PRN

## 2016-12-22 MED ORDER — DIPHENHYDRAMINE HCL 25 MG PO CAPS
25.0000 mg | ORAL_CAPSULE | ORAL | Status: DC | PRN
  Filled 2016-12-22 (×2): qty 1

## 2016-12-22 MED ORDER — FENTANYL CITRATE (PF) 100 MCG/2ML IJ SOLN: 25.0000 ug | INTRAMUSCULAR | Status: DC | PRN

## 2016-12-22 MED ORDER — PHENYLEPHRINE 40 MCG/ML (10ML) SYRINGE FOR IV PUSH (FOR BLOOD PRESSURE SUPPORT): 80.0000 ug | PREFILLED_SYRINGE | INTRAVENOUS | Status: DC | PRN

## 2016-12-22 MED ORDER — HYDRALAZINE HCL 20 MG/ML IJ SOLN: 10.0000 mg | Freq: Once | INTRAMUSCULAR | Status: DC | PRN

## 2016-12-22 MED ORDER — ONDANSETRON HCL 4 MG/2ML IJ SOLN
INTRAMUSCULAR | Status: DC | PRN
  Administered 2016-12-22: 4 mg via INTRAVENOUS

## 2016-12-22 MED ORDER — SODIUM BICARBONATE 8.4 % IV SOLN
INTRAVENOUS | Status: DC | PRN
  Administered 2016-12-22: 2 mL via EPIDURAL
  Administered 2016-12-22: 10 mL via EPIDURAL
  Administered 2016-12-22: 3 mL via EPIDURAL

## 2016-12-22 MED ORDER — SCOPOLAMINE 1 MG/3DAYS TD PT72
MEDICATED_PATCH | TRANSDERMAL | Status: AC
  Filled 2016-12-22: qty 1

## 2016-12-22 MED ORDER — SODIUM CHLORIDE 0.9% FLUSH: 3.0000 mL | INTRAVENOUS | Status: DC | PRN

## 2016-12-22 MED ORDER — OXYCODONE-ACETAMINOPHEN 5-325 MG PO TABS
1.0000 | ORAL_TABLET | ORAL | Status: DC | PRN
Start: 2016-12-22 — End: 2016-12-23

## 2016-12-22 MED ORDER — OXYCODONE-ACETAMINOPHEN 5-325 MG PO TABS: 2.0000 | ORAL_TABLET | ORAL | Status: DC | PRN

## 2016-12-22 MED ORDER — MISOPROSTOL 25 MCG QUARTER TABLET
25.0000 ug | ORAL_TABLET | ORAL | Status: DC | PRN
  Administered 2016-12-22 (×2): 25 ug via VAGINAL
  Filled 2016-12-22 (×2): qty 1

## 2016-12-22 MED ORDER — KETOROLAC TROMETHAMINE 30 MG/ML IJ SOLN
30.0000 mg | Freq: Four times a day (QID) | INTRAMUSCULAR | Status: AC | PRN
  Administered 2016-12-22: 30 mg via INTRAMUSCULAR

## 2016-12-22 MED ORDER — PHENYLEPHRINE 40 MCG/ML (10ML) SYRINGE FOR IV PUSH (FOR BLOOD PRESSURE SUPPORT)
80.0000 ug | PREFILLED_SYRINGE | INTRAVENOUS | Status: DC | PRN
  Filled 2016-12-22: qty 10

## 2016-12-22 MED ORDER — NALBUPHINE HCL 10 MG/ML IJ SOLN: 5.0000 mg | INTRAMUSCULAR | Status: DC | PRN

## 2016-12-22 MED ORDER — BUTORPHANOL TARTRATE 1 MG/ML IJ SOLN
1.0000 mg | INTRAMUSCULAR | Status: DC | PRN
  Administered 2016-12-22 (×2): 1 mg via INTRAVENOUS
  Filled 2016-12-22 (×2): qty 1

## 2016-12-22 MED ORDER — METOCLOPRAMIDE HCL 5 MG/ML IJ SOLN
INTRAMUSCULAR | Status: AC
Start: 2016-12-22 — End: 2016-12-22
  Filled 2016-12-22: qty 2

## 2016-12-22 MED ORDER — DEXAMETHASONE SODIUM PHOSPHATE 4 MG/ML IJ SOLN
INTRAMUSCULAR | Status: AC
  Filled 2016-12-22: qty 1

## 2016-12-22 MED ORDER — LACTATED RINGERS IV SOLN: INTRAVENOUS | Status: DC

## 2016-12-22 MED ORDER — SCOPOLAMINE 1 MG/3DAYS TD PT72: 1.0000 | MEDICATED_PATCH | Freq: Once | TRANSDERMAL | Status: DC

## 2016-12-22 MED ORDER — METOCLOPRAMIDE HCL 5 MG/ML IJ SOLN
INTRAMUSCULAR | Status: DC | PRN
  Administered 2016-12-22: 10 mg via INTRAVENOUS

## 2016-12-22 MED ORDER — PHENYLEPHRINE 40 MCG/ML (10ML) SYRINGE FOR IV PUSH (FOR BLOOD PRESSURE SUPPORT)
PREFILLED_SYRINGE | INTRAVENOUS | Status: AC
  Filled 2016-12-22: qty 10

## 2016-12-22 MED ORDER — DEXAMETHASONE SODIUM PHOSPHATE 4 MG/ML IJ SOLN
INTRAMUSCULAR | Status: DC | PRN
  Administered 2016-12-22: 4 mg via INTRAVENOUS

## 2016-12-22 MED ORDER — METOCLOPRAMIDE HCL 5 MG/ML IJ SOLN: 10.0000 mg | Freq: Once | INTRAMUSCULAR | Status: DC | PRN

## 2016-12-22 MED ORDER — EPHEDRINE 5 MG/ML INJ: 10.0000 mg | INTRAVENOUS | Status: DC | PRN

## 2016-12-22 MED ORDER — NALBUPHINE HCL 10 MG/ML IJ SOLN: 5.0000 mg | Freq: Once | INTRAMUSCULAR | Status: DC | PRN

## 2016-12-22 MED ORDER — OXYTOCIN 10 UNIT/ML IJ SOLN
INTRAVENOUS | Status: DC | PRN
  Administered 2016-12-22: 40 [IU] via INTRAVENOUS

## 2016-12-22 MED ORDER — OXYTOCIN 40 UNITS IN LACTATED RINGERS INFUSION - SIMPLE MED
1.0000 m[IU]/min | INTRAVENOUS | Status: DC
  Administered 2016-12-22 (×2): 2 m[IU]/min via INTRAVENOUS

## 2016-12-22 MED ORDER — OXYTOCIN 40 UNITS IN LACTATED RINGERS INFUSION - SIMPLE MED
2.5000 [IU]/h | INTRAVENOUS | Status: DC
  Filled 2016-12-22: qty 1000

## 2016-12-22 MED ORDER — LACTATED RINGERS IV SOLN
INTRAVENOUS | Status: DC | PRN
  Administered 2016-12-22: 22:00:00 via INTRAVENOUS

## 2016-12-22 MED ORDER — TERBUTALINE SULFATE 1 MG/ML IJ SOLN
0.2500 mg | Freq: Once | INTRAMUSCULAR | Status: DC | PRN
  Filled 2016-12-22: qty 1

## 2016-12-22 MED ORDER — SODIUM CHLORIDE 0.9 % IR SOLN
Status: DC | PRN
  Administered 2016-12-22: 1000 mL

## 2016-12-22 MED ORDER — OXYTOCIN 10 UNIT/ML IJ SOLN
INTRAMUSCULAR | Status: AC
  Filled 2016-12-22: qty 4

## 2016-12-22 MED ORDER — LACTATED RINGERS IV SOLN: 500.0000 mL | Freq: Once | INTRAVENOUS | Status: DC

## 2016-12-22 MED ORDER — LIDOCAINE HCL (PF) 1 % IJ SOLN
INTRAMUSCULAR | Status: DC | PRN
  Administered 2016-12-22: 5 mL via EPIDURAL
  Administered 2016-12-22: 6 mL via EPIDURAL

## 2016-12-22 MED ORDER — MEPERIDINE HCL 25 MG/ML IJ SOLN
INTRAMUSCULAR | Status: DC | PRN
  Administered 2016-12-22 (×2): 12.5 mg via INTRAVENOUS

## 2016-12-22 MED ORDER — TERBUTALINE SULFATE 1 MG/ML IJ SOLN
0.2500 mg | Freq: Once | INTRAMUSCULAR | Status: AC | PRN
  Administered 2016-12-22: 0.25 mg via SUBCUTANEOUS

## 2016-12-22 MED ORDER — OXYTOCIN BOLUS FROM INFUSION: 500.0000 mL | Freq: Once | INTRAVENOUS | Status: DC

## 2016-12-22 MED ORDER — DIPHENHYDRAMINE HCL 50 MG/ML IJ SOLN: 12.5000 mg | INTRAMUSCULAR | Status: DC | PRN

## 2016-12-22 MED ORDER — MORPHINE SULFATE (PF) 0.5 MG/ML IJ SOLN
INTRAMUSCULAR | Status: AC
  Filled 2016-12-22: qty 10

## 2016-12-22 MED ORDER — ONDANSETRON HCL 4 MG/2ML IJ SOLN: 4.0000 mg | Freq: Four times a day (QID) | INTRAMUSCULAR | Status: DC | PRN

## 2016-12-22 MED ORDER — SCOPOLAMINE 1 MG/3DAYS TD PT72
MEDICATED_PATCH | TRANSDERMAL | Status: DC | PRN
  Administered 2016-12-22: 1 via TRANSDERMAL

## 2016-12-22 MED ORDER — CEFAZOLIN SODIUM-DEXTROSE 2-4 GM/100ML-% IV SOLN
2.0000 g | INTRAVENOUS | Status: AC
  Administered 2016-12-22: 2 g via INTRAVENOUS
  Filled 2016-12-22: qty 100

## 2016-12-22 MED ORDER — NALOXONE HCL 2 MG/2ML IJ SOSY
1.0000 ug/kg/h | PREFILLED_SYRINGE | INTRAVENOUS | Status: DC | PRN
  Filled 2016-12-22: qty 2

## 2016-12-22 MED ORDER — ACETAMINOPHEN 325 MG PO TABS
650.0000 mg | ORAL_TABLET | ORAL | Status: DC | PRN
  Administered 2016-12-22: 650 mg via ORAL
  Filled 2016-12-22: qty 2

## 2016-12-22 MED ORDER — SOD CITRATE-CITRIC ACID 500-334 MG/5ML PO SOLN
30.0000 mL | ORAL | Status: DC | PRN
  Administered 2016-12-22 (×2): 30 mL via ORAL
  Filled 2016-12-22 (×2): qty 15

## 2016-12-22 MED ORDER — FENTANYL 2.5 MCG/ML BUPIVACAINE 1/10 % EPIDURAL INFUSION (WH - ANES)
14.0000 mL/h | INTRAMUSCULAR | Status: DC | PRN
  Administered 2016-12-22 (×2): 14 mL/h via EPIDURAL
  Filled 2016-12-22 (×2): qty 100

## 2016-12-22 MED ORDER — NALOXONE HCL 0.4 MG/ML IJ SOLN: 0.4000 mg | INTRAMUSCULAR | Status: DC | PRN

## 2016-12-22 MED ORDER — LIDOCAINE-EPINEPHRINE (PF) 2 %-1:200000 IJ SOLN
INTRAMUSCULAR | Status: AC
  Filled 2016-12-22: qty 20

## 2016-12-22 MED ORDER — LIDOCAINE HCL (PF) 1 % IJ SOLN: 30.0000 mL | INTRAMUSCULAR | Status: DC | PRN

## 2016-12-22 MED ORDER — ZOLPIDEM TARTRATE 5 MG PO TABS: 5.0000 mg | ORAL_TABLET | Freq: Every evening | ORAL | Status: DC | PRN

## 2016-12-22 SURGICAL SUPPLY — 36 items
BENZOIN TINCTURE PRP APPL 2/3 (GAUZE/BANDAGES/DRESSINGS) ×3 IMPLANT
CHLORAPREP W/TINT 26ML (MISCELLANEOUS) ×3 IMPLANT
CLAMP CORD UMBIL (MISCELLANEOUS) ×3 IMPLANT
CLOSURE WOUND 1/2 X4 (GAUZE/BANDAGES/DRESSINGS) ×1
CLOTH BEACON ORANGE TIMEOUT ST (SAFETY) ×3 IMPLANT
CONTAINER PREFILL 10% NBF 15ML (MISCELLANEOUS) IMPLANT
DRSG OPSITE POSTOP 4X10 (GAUZE/BANDAGES/DRESSINGS) ×3 IMPLANT
ELECT REM PT RETURN 9FT ADLT (ELECTROSURGICAL) ×3
ELECTRODE REM PT RTRN 9FT ADLT (ELECTROSURGICAL) ×1 IMPLANT
EXTRACTOR VACUUM M CUP 4 TUBE (SUCTIONS) IMPLANT
EXTRACTOR VACUUM M CUP 4' TUBE (SUCTIONS)
GLOVE BIO SURGEON STRL SZ 6.5 (GLOVE) ×6 IMPLANT
GLOVE BIO SURGEONS STRL SZ 6.5 (GLOVE) ×3
GLOVE BIOGEL PI IND STRL 7.0 (GLOVE) ×1 IMPLANT
GLOVE BIOGEL PI INDICATOR 7.0 (GLOVE) ×2
GOWN STRL REUS W/TWL LRG LVL3 (GOWN DISPOSABLE) ×9 IMPLANT
KIT ABG SYR 3ML LUER SLIP (SYRINGE) IMPLANT
NEEDLE HYPO 25X5/8 SAFETYGLIDE (NEEDLE) IMPLANT
NS IRRIG 1000ML POUR BTL (IV SOLUTION) ×3 IMPLANT
PACK C SECTION WH (CUSTOM PROCEDURE TRAY) ×3 IMPLANT
PAD OB MATERNITY 4.3X12.25 (PERSONAL CARE ITEMS) ×3 IMPLANT
PENCIL SMOKE EVAC W/HOLSTER (ELECTROSURGICAL) ×3 IMPLANT
RTRCTR C-SECT PINK 25CM LRG (MISCELLANEOUS) ×3 IMPLANT
STRIP CLOSURE SKIN 1/2X4 (GAUZE/BANDAGES/DRESSINGS) ×2 IMPLANT
SUT MNCRL 0 VIOLET CTX 36 (SUTURE) ×2 IMPLANT
SUT MONOCRYL 0 CTX 36 (SUTURE) ×4
SUT PLAIN 1 NONE 54 (SUTURE) IMPLANT
SUT PLAIN 2 0 XLH (SUTURE) ×3 IMPLANT
SUT VIC AB 0 CT1 27 (SUTURE) ×4
SUT VIC AB 0 CT1 27XBRD ANBCTR (SUTURE) ×2 IMPLANT
SUT VIC AB 2-0 CT1 27 (SUTURE) ×2
SUT VIC AB 2-0 CT1 TAPERPNT 27 (SUTURE) ×1 IMPLANT
SUT VIC AB 4-0 KS 27 (SUTURE) ×3 IMPLANT
SYR BULB IRRIGATION 50ML (SYRINGE) ×3 IMPLANT
TOWEL OR 17X24 6PK STRL BLUE (TOWEL DISPOSABLE) ×3 IMPLANT
TRAY FOLEY BAG SILVER LF 14FR (SET/KITS/TRAYS/PACK) ×3 IMPLANT

## 2016-12-22 NOTE — Progress Notes (Signed)
Patient ID: Crystal Hudson, female   DOB: 1981/04/10, 36 y.o.   MRN: [id number]   CTSP secondary to repetitve decels. Pt comfortable with epidural  AF BP much improved since epidural and pain relief  gen NAD FHTs 140-150's, mod var, repetitive variable, occ late decels, category 2 - recovered to 150's, mod var, category 1-2 toco was q 30sec-95min, nowq 3  Has received IVF bolus, terbutaline, position change  SVE 8.9/90/+1  Continue close monitoring

## 2016-12-22 NOTE — Anesthesia Pain Management Evaluation Note (Signed)
  CRNA Pain Management Visit Note  Patient: Crystal Hudson, 36 y.o., female  "Hello I am a member of the anesthesia team at [facility] We have an anesthesia team available at all times to provide care throughout the hospital, including epidural management and anesthesia for C-section. I don't know your plan for the delivery whether it a natural birth, water birth, IV sedation, nitrous supplementation, doula or epidural, but we want to meet your pain goals."   1.Was your pain managed to your expectations on prior hospitalizations?   No prior hospitalizations  2.What is your expectation for pain management during this hospitalization?     Epidural  3.How can we help you reach that goal?   Record the patient's initial score and the patient's pain goal.   Pain: 10  Pain Goal: 3 [facility] wants you to be able to say your pain was always managed very well.  Jabier Mutton 12/22/2016

## 2016-12-22 NOTE — Brief Op Note (Signed)
12/22/2016  11:05 PM  PATIENT:  Crystal Hudson  36 y.o. female  PRE-OPERATIVE DIAGNOSIS:  Primary C/S; arrest of dilitation  POST-OPERATIVE DIAGNOSIS:  Primary C/S; arrest of dilitationi  PROCEDURE:  Procedure(s): CESAREAN SECTION (N/A)  SURGEON:  Surgeon(s) and Role:    * Carah Barrientes Bovard-Stuckert, MD - Primary    * Truett Mainland, DO - Assisting  ANESTHESIA:   epidural  EBL:  Total I/O In: 3000 [I.V.:3000] Out: 1013 [Urine:100; Blood:913]   FINDINGS: viable female infant at 22:17, apgars 9/9, weight P, nl uterus with small fibroids, nl B ovaries and fallopian tubes  BLOOD ADMINISTERED:none  DRAINS: Urinary Catheter (Foley)   LOCAL MEDICATIONS USED:  NONE  SPECIMEN:  Source of Specimen:  Placenta  DISPOSITION OF SPECIMEN:  L&D  COUNTS:  YES  TOURNIQUET:  * No tourniquets in log *  DICTATION: .Other Dictation: Dictation Number [id number]  PLAN OF CARE: Admit to inpatient   PATIENT DISPOSITION:  PACU - hemodynamically stable.   Delay start of Pharmacological VTE agent (>24hrs) due to surgical blood loss or risk of bleeding: not applicable

## 2016-12-22 NOTE — Progress Notes (Signed)
Patient ID: Crystal Hudson, female   DOB: 02/28/1981, 36 y.o.   MRN: [id number]   Comfortable with epidural  AFVSS  gen NAD FHTs 145, mod var, category 2 variable decels with last 5 ctx toco Q 73min  Pit off, fluid bolus  Baby has recovered.  Close monitoring  D/W pt r/b/a of LTCS including bleeding, infection, damage to surrounding organs, injury to infant or difficulty healing.  Answered questions.

## 2016-12-22 NOTE — Anesthesia Procedure Notes (Signed)
Epidural Patient location during procedure: OB Start time: 12/22/2016 8:07 AM End time: 12/22/2016 8:11 AM  Staffing Anesthesiologist: Lyn Hollingshead Performed: anesthesiologist   Preanesthetic Checklist Completed: patient identified, surgical consent, pre-op evaluation, timeout performed, IV checked, risks and benefits discussed and monitors and equipment checked  Epidural Patient position: sitting Prep: site prepped and draped and DuraPrep Patient monitoring: continuous pulse ox and blood pressure Approach: midline Location: L3-L4 Injection technique: LOR air  Needle:  Needle type: Tuohy  Needle gauge: 17 G Needle length: 9 cm and 9 Needle insertion depth: 7 cm Catheter type: closed end flexible Catheter size: 19 Gauge Catheter at skin depth: 12 cm Test dose: negative and Other  Assessment Sensory level: T10 Events: blood not aspirated, injection not painful, no injection resistance, negative IV test and no paresthesia  Additional Notes Reason for block:procedure for pain

## 2016-12-22 NOTE — Progress Notes (Signed)
Patient ID: Crystal Hudson, female   DOB: [birth year], 36 y.o.   MRN: [id number]   No changes to H&P - Overnight had ROM, cytotec placed at 5:30am.  Comfortable with epidural.  AF 130/90 gen NAD FHTs 135 mod var, category 1 toco q 47min  Will start pitocin 4 hr after cytotec Monitor BP

## 2016-12-22 NOTE — Anesthesia Preprocedure Evaluation (Signed)
Anesthesia Evaluation  Patient identified by MRN, date of birth, ID band Patient awake    Reviewed: Allergy & Precautions, H&P , NPO status , Patient's Chart, lab work & pertinent test results  Airway Mallampati: III  TM Distance: >3 FB Neck ROM: full    Dental no notable dental hx.    Pulmonary neg pulmonary ROS,    Pulmonary exam normal        Cardiovascular negative cardio ROS Normal cardiovascular exam     Neuro/Psych negative neurological ROS  negative psych ROS   GI/Hepatic negative GI ROS, Neg liver ROS,   Endo/Other  Morbid obesity  Renal/GU negative Renal ROS     Musculoskeletal negative musculoskeletal ROS (+)   Abdominal (+) + obese,   Peds  Hematology negative hematology ROS (+) anemia ,   Anesthesia Other Findings   Reproductive/Obstetrics (+) Pregnancy                             Anesthesia Physical Anesthesia Plan  ASA: III  Anesthesia Plan: Epidural   Post-op Pain Management:    Induction:   Airway Management Planned:   Additional Equipment:   Intra-op Plan:   Post-operative Plan:   Informed Consent: I have reviewed the patients History and Physical, chart, labs and discussed the procedure including the risks, benefits and alternatives for the proposed anesthesia with the patient or authorized representative who has indicated his/her understanding and acceptance.     Plan Discussed with:   Anesthesia Plan Comments:         Anesthesia Quick Evaluation

## 2016-12-22 NOTE — Progress Notes (Signed)
Patient ID: Crystal Hudson, female   DOB: 02/17/1981, 36 y.o.   MRN: [id number]   Pt with continued variable, increased after pitocin started SVE unchanged. Will proceed with LTCS D/w pt, wishes to proceed

## 2016-12-22 NOTE — Progress Notes (Signed)
Patient ID: Stephnie Parlier, female   DOB: Jan 26, 1981, 36 y.o.   MRN: [id number]   Comfortable with epidural, some pressure  AFVSS gen NAD FHTs 145 mod var, some variables, category 2 toco q 20min  SVE 8.9/90/0-+1  D/W pt again slow/no cervical change over 6+ hours.  Likely need for delivery by LTCS.  Pt opts to try to augment and attempt for SVD recheck in 1-2 hours..  D/W pt if baby doesn't tolerate labor will possibly need to deliver.    Continue close monitoring

## 2016-12-22 NOTE — Progress Notes (Signed)
Patient ID: Crystal Hudson, female   DOB: [birth year], 36 y.o.   MRN: [id number]   Late entry: As getting ready to roll to OR, GYN emergency (torsed ovary) bumped case.  FHTs 150-160's, min-mod var, occ variables, category 2 SVE unchanged toco not tracing  Pitocin off  Will proceed with section ASAP - close monitoring

## 2016-12-22 NOTE — Transfer of Care (Signed)
Immediate Anesthesia Transfer of Care Note  Patient: Crystal Hudson  Procedure(s) Performed: Procedure(s): CESAREAN SECTION (N/A)  Patient Location: PACU  Anesthesia Type:Epidural  Level of Consciousness: awake  Airway & Oxygen Therapy: Patient Spontanous Breathing  Post-op Assessment: Report given to RN and Post -op Vital signs reviewed and stable  Post vital signs: Reviewed, stable. HR 124, RR 12, SaO2 97%, BP 132/  83  Last Vitals:  Vitals:   12/22/16 2131 12/22/16 2152  BP: 125/69 (!) 143/69  Pulse: (!) 125 (!) 130  Resp: 20 20  Temp:      Last Pain:  Vitals:   12/22/16 2152  TempSrc:   PainSc: 0-No pain      Patients Stated Pain Goal: 3 (85/27/78 2423)  Complications: No apparent anesthesia complications

## 2016-12-23 ENCOUNTER — Encounter (HOSPITAL_COMMUNITY): Payer: Self-pay | Admitting: Obstetrics and Gynecology

## 2016-12-23 DIAGNOSIS — Z3A40 40 weeks gestation of pregnancy: Secondary | ICD-10-CM

## 2016-12-23 LAB — CBC
HCT: 27.1 % — ABNORMAL LOW (ref 36.0–46.0)
Hemoglobin: 8.8 g/dL — ABNORMAL LOW (ref 12.0–15.0)
MCH: 26.9 pg (ref 26.0–34.0)
MCHC: 32.5 g/dL (ref 30.0–36.0)
MCV: 82.9 fL (ref 78.0–100.0)
PLATELETS: 195 10*3/uL (ref 150–400)
RBC: 3.27 MIL/uL — AB (ref 3.87–5.11)
RDW: 15.3 % (ref 11.5–15.5)
WBC: 20 10*3/uL — ABNORMAL HIGH (ref 4.0–10.5)

## 2016-12-23 MED ORDER — DIPHENHYDRAMINE HCL 25 MG PO CAPS
25.0000 mg | ORAL_CAPSULE | Freq: Four times a day (QID) | ORAL | Status: DC | PRN
  Administered 2016-12-24: 25 mg via ORAL

## 2016-12-23 MED ORDER — OXYCODONE HCL 5 MG PO TABS
10.0000 mg | ORAL_TABLET | ORAL | Status: DC | PRN
  Administered 2016-12-24 – 2016-12-25 (×2): 10 mg via ORAL
  Filled 2016-12-23 (×2): qty 2

## 2016-12-23 MED ORDER — IBUPROFEN 800 MG PO TABS
800.0000 mg | ORAL_TABLET | Freq: Three times a day (TID) | ORAL | Status: DC
  Administered 2016-12-23 – 2016-12-25 (×6): 800 mg via ORAL
  Filled 2016-12-23 (×7): qty 1

## 2016-12-23 MED ORDER — ZOLPIDEM TARTRATE 5 MG PO TABS: 5.0000 mg | ORAL_TABLET | Freq: Every evening | ORAL | Status: DC | PRN

## 2016-12-23 MED ORDER — PRENATAL MULTIVITAMIN CH
1.0000 | ORAL_TABLET | Freq: Every day | ORAL | Status: DC
  Administered 2016-12-23 – 2016-12-25 (×3): 1 via ORAL
  Filled 2016-12-23 (×3): qty 1

## 2016-12-23 MED ORDER — SIMETHICONE 80 MG PO CHEW
80.0000 mg | CHEWABLE_TABLET | Freq: Three times a day (TID) | ORAL | Status: DC
  Administered 2016-12-23 – 2016-12-25 (×7): 80 mg via ORAL
  Filled 2016-12-23 (×7): qty 1

## 2016-12-23 MED ORDER — DIBUCAINE 1 % RE OINT: 1.0000 "application " | TOPICAL_OINTMENT | RECTAL | Status: DC | PRN

## 2016-12-23 MED ORDER — OXYTOCIN 40 UNITS IN LACTATED RINGERS INFUSION - SIMPLE MED: 2.5000 [IU]/h | INTRAVENOUS | Status: AC

## 2016-12-23 MED ORDER — ACETAMINOPHEN 325 MG PO TABS: 650.0000 mg | ORAL_TABLET | ORAL | Status: DC | PRN

## 2016-12-23 MED ORDER — SIMETHICONE 80 MG PO CHEW
80.0000 mg | CHEWABLE_TABLET | ORAL | Status: DC
  Administered 2016-12-23 – 2016-12-25 (×2): 80 mg via ORAL
  Filled 2016-12-23 (×2): qty 1

## 2016-12-23 MED ORDER — SENNOSIDES-DOCUSATE SODIUM 8.6-50 MG PO TABS
2.0000 | ORAL_TABLET | ORAL | Status: DC
  Administered 2016-12-23 – 2016-12-25 (×2): 2 via ORAL
  Filled 2016-12-23 (×2): qty 2

## 2016-12-23 MED ORDER — WITCH HAZEL-GLYCERIN EX PADS: 1.0000 "application " | MEDICATED_PAD | CUTANEOUS | Status: DC | PRN

## 2016-12-23 MED ORDER — SIMETHICONE 80 MG PO CHEW: 80.0000 mg | CHEWABLE_TABLET | ORAL | Status: DC | PRN

## 2016-12-23 MED ORDER — COCONUT OIL OIL: 1.0000 "application " | TOPICAL_OIL | Status: DC | PRN

## 2016-12-23 MED ORDER — LACTATED RINGERS IV SOLN
INTRAVENOUS | Status: DC
  Administered 2016-12-23: 09:00:00 via INTRAVENOUS

## 2016-12-23 MED ORDER — MENTHOL 3 MG MT LOZG: 1.0000 | LOZENGE | OROMUCOSAL | Status: DC | PRN

## 2016-12-23 MED ORDER — OXYCODONE HCL 5 MG PO TABS
5.0000 mg | ORAL_TABLET | ORAL | Status: DC | PRN
  Administered 2016-12-24 (×3): 5 mg via ORAL
  Filled 2016-12-23 (×3): qty 1

## 2016-12-23 NOTE — Lactation Note (Signed)
This note was copied from a baby's chart. Lactation Consultation Note  Patient Name: Boy Akina Maish GHWEX'H Date: 12/23/2016 Reason for consult: Follow-up assessment  Lc  Called to room by secretary.  Mom attempting latch with baby in modified football hold.  Baby is awake and alert, but not latching.  Mom reports recent diaper change and LC observed baby to appear gaggy some.   Mom is able to hand express a few drops to baby's mouth, baby licks, but no latching at this time.  Mom to call as needed for latch assist when baby is ready. Few feedings recorded already and mom reports baby did well earlier today.     Maternal Data Has patient been taught Hand Expression?: Yes Does the patient have breastfeeding experience prior to this delivery?: No  Feeding Feeding Type: Breast Fed  LATCH Score/Interventions Latch: Too sleepy or reluctant, no latch achieved, no sucking elicited. Intervention(s): Adjust position;Assist with latch;Breast compression  Audible Swallowing: None  Type of Nipple: Everted at rest and after stimulation  Comfort (Breast/Nipple): Soft / non-tender     Hold (Positioning): Assistance needed to correctly position infant at breast and maintain latch. Intervention(s): Breastfeeding basics reviewed;Support Pillows;Position options;Skin to skin  LATCH Score: 5  Lactation Tools Discussed/Used     Consult Status Consult Status: Follow-up Date: 12/24/16 Follow-up type: In-patient    Kendell Bane Justine Null 12/23/2016, 5:17 PM

## 2016-12-23 NOTE — Lactation Note (Signed)
This note was copied from a baby's chart. Lactation Consultation Note  Patient Name: Crystal Hudson JJHER'D Date: 12/23/2016 Reason for consult: Initial assessment Breastfeeding consultation services and support information given and reviewed.  This is mom's first baby and baby is 44 hours old.  Mom states baby is nursing well but sleepy this PM.  Instructed to feed with any feeding cue and to call for assist/concerns.  Maternal Data Does the patient have breastfeeding experience prior to this delivery?: No  Feeding Feeding Type: Breast Fed  LATCH Score/Interventions                      Lactation Tools Discussed/Used     Consult Status Consult Status: Follow-up Date: 12/24/16 Follow-up type: In-patient    Ave Filter 12/23/2016, 3:27 PM

## 2016-12-23 NOTE — Anesthesia Postprocedure Evaluation (Signed)
Anesthesia Post Note  Patient: Crystal Hudson  Procedure(s) Performed: Procedure(s) (LRB): CESAREAN SECTION (N/A)  Patient location during evaluation: Mother Baby Anesthesia Type: Epidural Level of consciousness: awake and alert and oriented Pain management: pain level controlled Vital Signs Assessment: post-procedure vital signs reviewed and stable Respiratory status: spontaneous breathing and nonlabored ventilation Cardiovascular status: stable Postop Assessment: no headache, epidural receding, patient able to bend at knees, adequate PO intake, no backache and no signs of nausea or vomiting Anesthetic complications: no        Last Vitals:  Vitals:   12/23/16 0234 12/23/16 0335  BP: 136/84 128/88  Pulse: (!) 112 (!) 110  Resp: 18 18  Temp: 36.8 C 36.7 C    Last Pain:  Vitals:   12/23/16 0600  TempSrc:   PainSc: 1    Pain Goal: Patients Stated Pain Goal: 3 (12/22/16 0641)               Jabier Mutton

## 2016-12-23 NOTE — Op Note (Signed)
NAME:  Crystal Hudson, Crystal Hudson                       ACCOUNT NO.:  MEDICAL RECORD NO.:  [id number]  LOCATION:                                 FACILITY:  PHYSICIAN:  Thornell Sartorius, MD             DATE OF BIRTH:  DATE OF PROCEDURE:  12/22/2016 DATE OF DISCHARGE:                              OPERATIVE REPORT   PREOPERATIVE DIAGNOSIS:  Intrauterine pregnancy at 40+ weeks with arrest of dilatation and repetitive variable deceleration.  POSTOPERATIVE DIAGNOSIS:  Intrauterine pregnancy at 40+ weeks with arrest of dilatation and repetitive variable deceleration, status post low transverse cesarean section.  PROCEDURE:  Low transverse cesarean section.  SURGEON:  Thornell Sartorius, MD.  ASSISTANTLoma Boston, DO.  ANESTHESIA:  Epidural.  IV FLUIDS:  3000 mL.  URINE OUTPUT:  100 mL clear urine at the end of the procedure.  ESTIMATED BLOOD LOSS:  Approximately 913 mL.  FINDINGS:  A viable female infant at 2217 with Apgars of 9 at one minute, 9 at five minutes.  Weight pending at the time of dictation.  Normal uterus with small subserosal fibroids.  Normal bilateral ovaries and fallopian tubes.  COMPLICATIONS:  None.  PATHOLOGY:  Placenta to Labor and Delivery.  DESCRIPTION OF PROCEDURE:  After informed consent was reviewed with the patient and her family and the OR became available after some stat GYN case, the patient was transported to the OR.  Her epidural had been dosed.  She was placed on the table in supine position with a leftward tilt.  A Pfannenstiel skin incision was made at the level of 2 fingerbreadths above her pubic symphysis, carried through to the underlying layer of fascia sharply.  The fascia was incised in the midline.  The midline incision was extended laterally with Mayo scissors.  The superior aspect of the fascial incision was grasped with Kocher clamps, elevated, and the rectus muscles were dissected off both bluntly and sharply.  Midline was easily identified.  The  peritoneum was entered bluntly.  Incision was extended superiorly and inferiorly with good visualization of the bladder.  The Alexis skin retractor was placed carefully making sure no bowel was entrapped.  The uterus was explored and incised in a transverse fashion.  Incision was extended, and the infant was delivered from a vertex presentation.  Nose and mouth were suctioned on the field.  Cord was clamped and cut.  Infant was handed off to the awaiting pediatric staff.  The placenta was expressed from the uterus.  The uterus was cleared of all clots and debris.  Uterine incision was closed in 2 layers of 0 Monocryl, the first of which was running locked and second was an imbricating layer.  The gutters were cleared of all clot and debris, and the incision was noted to be hemostatic.  The Alexis skin retractor was removed.  The peritoneum was reapproximated with 2-0 Vicryl.  The subfascial planes were inspected and found to be hemostatic.  The fascia was then reapproximated with 0 Vicryl in a running fashion from either angle overlapping in the midline.  The subcuticular adipose tissue was made hemostatic  with Bovie cautery, and the dead space was closed with 3-0 plain gut.  The skin was closed with 4-0 Vicryl in a subcuticular fashion.  Benzoin and Steri- Strips were applied.  The patient tolerated the procedure well.  Sponge, lap, and needle count was correct x2 at the end of surgery per the operating room staff.     Thornell Sartorius, MD     JB/MEDQ  D:  12/22/2016  T:  12/23/2016  Job:  [id number]

## 2016-12-23 NOTE — Progress Notes (Signed)
Subjective: Postpartum Day #1: Cesarean Delivery Patient reports nausea, incisional pain and tolerating PO.    Objective: Vital signs in last 24 hours: Temp:  [98.1 F (36.7 C)-99.3 F (37.4 C)] 98.1 F (36.7 C) (04/11 0335) Pulse Rate:  [101-131] 110 (04/11 0335) Resp:  [0-27] 18 (04/11 0335) BP: (119-156)/(59-111) 128/88 (04/11 0335) SpO2:  [95 %-100 %] 99 % (04/11 0500)  Physical Exam:  General: alert Lochia: appropriate Uterine Fundus: firm Incision: dressing C/D/I    Recent Labs  12/22/16 0719 12/23/16 0525  HGB 9.5* 8.8*  HCT 29.6* 27.1*    Assessment/Plan: Status post Cesarean section. Doing well postoperatively.  Continue current care, ambulate, remove foley.  Crystal Hudson 12/23/2016, 8:43 AM

## 2016-12-24 ENCOUNTER — Encounter (HOSPITAL_COMMUNITY): Payer: Self-pay

## 2016-12-24 NOTE — Lactation Note (Signed)
This note was copied from a baby's chart. Lactation Consultation Note Baby had low CBG's 38, serum will be drawn again at 0230. Baby wouldn't latch to breast. Formula given. LC attempted to latch baby w/no interest.  Hand expressed 2 ml colostrum from Lt. Breast.  Mom has LARGE pendulum breast. Lt. Breast soft/compressible. Large Everted nipple compressible. Rt. Breast w/edema. Areola and nipple not compressible. Reverse pressure helpful in softening top of areola, not the bottom. Unable to attempt to latch d/t edema. Encouraged mom to wear bra and elevate breast. Shells given to evert nipple and reduce swelling. Baby STS during hand expression. Colostrum given w/gloved finger and syring. RN set up DEBP. LC started mom pumping w/noted some colostrum before leaving rm.  No jitteriness while LC in rm. Patient Name: Crystal Hudson XKGYJ'E Date: 12/24/2016 Reason for consult: Follow-up assessment;Other (Comment) (hypoglycemia )   Maternal Data    Feeding Feeding Type: Breast Milk with Formula added Nipple Type: Slow - flow Length of feed: 0 min  LATCH Score/Interventions Latch: Too sleepy or reluctant, no latch achieved, no sucking elicited. Intervention(s): Skin to skin;Teach feeding cues;Waking techniques Intervention(s): Adjust position;Assist with latch;Breast massage;Breast compression  Audible Swallowing: None Intervention(s): Skin to skin;Hand expression  Type of Nipple: Everted at rest and after stimulation  Comfort (Breast/Nipple): Soft / non-tender     Hold (Positioning): Full assist, staff holds infant at breast Intervention(s): Breastfeeding basics reviewed;Support Pillows;Position options;Skin to skin  LATCH Score: 4  Lactation Tools Discussed/Used Tools: Shells;Pump Shell Type: Inverted Breast pump type: Double-Electric Breast Pump WIC Program: Yes Pump Review: Setup, frequency, and cleaning;Milk Storage Initiated by:: RN/K. [place] Date initiated::  12/24/16   Consult Status Consult Status: Follow-up Date: 12/24/16 Follow-up type: In-patient    Theodoro Kalata 12/24/2016, 2:33 AM

## 2016-12-24 NOTE — Lactation Note (Addendum)
This note was copied from a baby's chart. Lactation Consultation Note  Patient Name: Crystal Hudson WIOMB'T Date: 12/24/2016 Reason for consult: Follow-up assessment;Difficult latch  Baby 60 hours old. Mom reports that she had given formula because baby's glucose low, and then she has continued to give formula. Mom able to hand express with colostrum flowing, so assisted mom with positioning. Mom has large breast, so rolled a cloth diaper and placed under mom's breast for support. Baby latched deeply to left breast in football position with lips flanged and suckled rhythmically with a few swallows noted. Enc mom to keep putting baby to breast with cues and supplement only if baby not satisfied at breast. Enc mom to hand express to get EBM flowing before latching so baby will open mouth wide and latch deeply. Discussed progression of milk coming to volume and supply and demand. Enc mom to use DEP if baby not latching well at a feeding.   Maternal Data    Feeding Feeding Type: Breast Fed Nipple Type: Regular Length of feed:  (LC assessed first 10 minutes of BF. )  LATCH Score/Interventions Latch: Grasps breast easily, tongue down, lips flanged, rhythmical sucking. Intervention(s): Waking techniques Intervention(s): Adjust position;Assist with latch;Breast compression  Audible Swallowing: A few with stimulation Intervention(s): Hand expression  Type of Nipple: Everted at rest and after stimulation  Comfort (Breast/Nipple): Soft / non-tender     Hold (Positioning): Assistance needed to correctly position infant at breast and maintain latch. Intervention(s): Breastfeeding basics reviewed;Support Pillows;Position options;Skin to skin  LATCH Score: 8  Lactation Tools Discussed/Used     Consult Status Consult Status: Follow-up Date: 12/25/16 Follow-up type: In-patient    Andres Labrum 12/24/2016, 2:27 PM

## 2016-12-24 NOTE — Clinical Social Work Maternal (Signed)
  CLINICAL SOCIAL WORK MATERNAL/CHILD NOTE  Patient Details  Name: Crystal Hudson MRN: [id number] Date of Birth: 1981/05/07  Date:  12/24/2016  Clinical Social Worker Initiating Note:  Laurey Arrow Date/ Time Initiated:  12/24/16/1323     Child's Name:  Crystal Hudson   Legal Guardian:  Mother (FOB is Crystal Hudson 06/16/1974)   Need for Interpreter:  None   Date of Referral:  12/23/16     Reason for Referral:  Current Substance Use/Substance Use During Pregnancy    Referral Source:  Central Nursery   Address:  [address] [place] [address]  Phone number:  [phone]   Household Members:  Self, Significant Other   Natural Supports (not living in the home):  Immediate Family, Extended Family   Professional Supports: None   Employment: Unemployed   Type of Work:     Education:  Geophysical data processor Resources:  [employer]   Other Resources:  [employer] (CSW provided MOB with information to apply for [employer].)   Cultural/Religious Considerations Which May Impact Care:  None Reported  Strengths:  Ability to meet basic needs , Home prepared for child , Pediatrician chosen    Risk Factors/Current Problems:  None   Cognitive State:  Alert , Able to Concentrate , Linear Thinking , Insightful , Goal Oriented    Mood/Affect:  Relaxed , Interested , Bright , Happy    CSW Assessment: CSW met with MOB to complete an assessment for hx of substance use.  MOB was polite, inviting, and interested in meeting with CSW.  When CSW arrived, MOB was resting in bed, FOB was sitting on couch and MOB's mother was observing infant in the bassinet. MOB gave CSW permission to complete assessment while FOB and MOB's mother was present. CSW inquired about MOB's substance use during pregnancy. MOB denied substance use and appeared confused.  MOB stated that the MOB miscommunicated some information to MOB's OBGYN practice, but has never engaged in any substance use.  MOB, FOB,  and MOB's mother made humor out of the situation and MOB adamantly denied the use of any substance.  CSW informed MOB of the hospital's policy and procedure regarding substance use during pregnancy. CSW informed MOB of the two screenings for the infant. CSW informed MOB that the infant's UDS was negative and CSW will continue to monitor the infant's CDS. CSW made MOB aware that if infant's CDS was positive without an explanation, CSW will make a CPS report to [facility] CPS. MOB denied having a SA addiction and declined resources and interventions for SA treatment.  MOB did not have any questions regarding the hospital's policy.  CSW educated MOB about PPD.  CSW informed MOB of possible supports and interventions to decrease PPD and reviewed supports for MOB. CSW also encouraged MOB to seek medical attention if needed for increased signs and symptoms for PPD. CSW also provided SIDS education and MOB and FOB asked appropriate questions and responded appropriately to CSW's questions. CSW thanked MOB for meeting with CSW, and encouraged MOB to reach out to CSW if any questions arise.    CSW Plan/Description:  Information/Referral to [employer] , No Further Intervention Required/No Barriers to Discharge, Patient/Family Education  (CSW will monitor infant's cord and will make a report if warranted. )   Laurey Arrow, MSW, LCSW Clinical Social Work [phone]    Dimple Nanas, LCSW 12/24/2016, 1:33 PM

## 2016-12-24 NOTE — Progress Notes (Signed)
Subjective: Postpartum Day 2: Cesarean Delivery Patient reports incisional pain, tolerating PO and no problems voiding.    Objective: Vital signs in last 24 hours: Temp:  [98.2 F (36.8 C)-99.1 F (37.3 C)] 98.4 F (36.9 C) (04/12 0522) Pulse Rate:  [102-112] 105 (04/12 0522) Resp:  [18-20] 18 (04/12 0522) BP: (102-122)/(48-85) 102/48 (04/12 0522) SpO2:  [99 %-100 %] 99 % (04/11 1700)  Physical Exam:  General: alert and cooperative Lochia: appropriate Uterine Fundus: firm Incision: C/D/I Recent Labs  12/22/16 0719 12/23/16 0525  HGB 9.5* 8.8*  HCT 29.6* 27.1*    Assessment/Plan: Status post Cesarean section. Doing well postoperatively.  Still working on ambulating and feeds.  Baby had some initial low blood sugars, but have stabilized Continue current care, plan d/c tomorrow. Pt plans circumcision in office  Logan Bores 12/24/2016, 9:41 AM

## 2016-12-25 ENCOUNTER — Inpatient Hospital Stay (HOSPITAL_COMMUNITY): Payer: Medicaid Other

## 2016-12-25 ENCOUNTER — Ambulatory Visit: Payer: Self-pay

## 2016-12-25 MED ORDER — OXYCODONE HCL 5 MG PO TABS: 5.0000 mg | ORAL_TABLET | Freq: Four times a day (QID) | ORAL | 0 refills | Status: AC | PRN

## 2016-12-25 MED ORDER — IBUPROFEN 800 MG PO TABS: 800.0000 mg | ORAL_TABLET | Freq: Three times a day (TID) | ORAL | 1 refills | Status: AC

## 2016-12-25 MED ORDER — PRENATAL PLUS 27-1 MG PO TABS: 1.0000 | ORAL_TABLET | Freq: Every day | ORAL | 3 refills | Status: AC

## 2016-12-25 NOTE — Lactation Note (Signed)
This note was copied from a baby's chart. Lactation Consultation Note  Patient Name: Crystal Hudson RJJOA'C Date: 12/25/2016 Reason for consult: Follow-up assessment;Difficult latch;Breast/nipple pain;Pump rental Mom called for assist with latch before d/c home and [facility] loaner pump rental. Attempted to latch baby using breast compression but baby could not obtain latch, very fussy. Tried 20/24 nipple shield but not staying on Mom well. LC did not feel Mom good candidate for nipple shield at this time. Plan for d/c home is to pump/bottle. Pump every 3 hours for 15-20 minutes to encourage milk production, prevent engorgement and protect milk supply. Reviewed supply/demand. Supplement baby according to hours of age guidelines up to 60 ml with each feeding at least every 3 hours or on demand.  Engorgement care reviewed if needed. [facility] referral faxed. Offered OP f/u if Mom decides to work on latch once her milk comes in. Mom will call if desired. Encouraged to call for questions/concerns.   Maternal Data    Feeding Feeding Type: Formula Nipple Type: Slow - flow Length of feed: 0 min  LATCH Score/Interventions                      Lactation Tools Discussed/Used Tools: Nipple Shields;Pump;Shells Nipple shield size: 24 Shell Type: Inverted Breast pump type: Double-Electric Breast Pump   Consult Status Consult Status: Complete Date: 12/25/16 Follow-up type: In-patient    Crystal Hudson 12/25/2016, 2:37 PM

## 2016-12-25 NOTE — Lactation Note (Signed)
This note was copied from a baby's chart. Lactation Consultation Note  Patient Name: Crystal Hudson MHWKG'S Date: 12/25/2016 Reason for consult: Follow-up assessment;Breast/nipple pain;Difficult latch Mom reports baby recently had bottle but baby becoming fussy so Mom wanted assist with latch. Mom has been pump/bottle feeding due to sore nipples and baby not able to latch. Attempted to latch baby but he would not latch and very fussy at breast. Martin Majestic to get nipple shield to see if this would help with latch but baby fell asleep. Mom to call with next feeding for assist with latch. Mom interested in [place], [place] advised MOm to call the [facility] office this am to see if they have pump available. Will await Mom's call.   Maternal Data    Feeding Feeding Type: Breast Fed Nipple Type: Slow - flow  LATCH Score/Interventions                      Lactation Tools Discussed/Used Tools: Pump;Shells Shell Type: Inverted Breast pump type: Double-Electric Breast Pump   Consult Status Consult Status: Follow-up Date: 12/25/16 Follow-up type: In-patient    Katrine Coho 12/25/2016, 10:32 AM

## 2016-12-25 NOTE — Progress Notes (Signed)
Subjective: Postpartum Day 3: Cesarean Delivery Patient reports incisional pain and tolerating PO.  Pain controlled, ambulating and voiding w/o difficulty.  Objective: Vital signs in last 24 hours: Temp:  [98.1 F (36.7 C)-98.3 F (36.8 C)] 98.3 F (36.8 C) (04/13 0700) Pulse Rate:  [93-98] 98 (04/13 0700) Resp:  [18-20] 18 (04/13 0700) BP: (125-131)/(79-85) 128/79 (04/13 0700)  Physical Exam:  General: alert and no distress Lochia: appropriate Uterine Fundus: firm Incision: healing well DVT Evaluation: No evidence of DVT seen on physical exam.   Recent Labs  12/23/16 0525  HGB 8.8*  HCT 27.1*    Assessment/Plan: Status post Cesarean section. Doing well postoperatively.  Discharge home with standard precautions and return to clinic in 2 weeks. D/C with motrin, percocet and PNV.    Crystal Hudson 12/25/2016, 7:37 AM

## 2016-12-25 NOTE — Progress Notes (Signed)
Called and reported the following info to Dr Sandford Craze: Pt complained of SOB after going to bathroom. Pt stated she coughed and had pain in bathroom. Current assessment is: pain of 2, no SOB Oxygen sat 99%, HR 98, BP 122/72, RR 20, Clear lungs bilateral, FF 1below U, scant lochia, No headache , no blurred vision, No upper gastric pain, pedal edema unchanged from AM assessment. Dr Melba Coon stated Okay to discharge pt to home.

## 2016-12-25 NOTE — Discharge Summary (Signed)
OB Discharge Summary     Patient Name: Crystal Hudson DOB: 17-Mar-1981 MRN: [id number]  Date of admission: 12/22/2016 Delivering MD: Janyth Contes   Date of discharge: 12/25/2016  Admitting diagnosis: INDUCTION Intrauterine pregnancy: [redacted]w[redacted]d     Secondary diagnosis:  Principal Problem:   S/P cesarean section Active Problems:   Term pregnancy  Additional problems: N/A     Discharge diagnosis: Term Pregnancy Delivered                                                                                                Post partum procedures:N/A  Augmentation: AROM, Pitocin and Cytotec  Complications: None  Hospital course:  Induction of Labor With Vaginal Delivery   36 y.o. yo [id number] at [redacted]w[redacted]d was admitted to the hospital 12/22/2016 for induction of labor.  Indication for induction: Postdates.  Patient had an uncomplicated labor course as follows: Membrane Rupture Time/Date: 5:15 AM ,12/22/2016   Intrapartum Procedures: Episiotomy: None [1]                                         Lacerations:  None [1]  Patient had delivery of a Viable infant.  Information for the patient's newborn:  Thelia, Tanksley [[id number]]      12/22/2016  Details of delivery can be found in separate delivery note.  Patient had a routine postpartum course. Patient is discharged home 12/25/16.  Physical exam  Vitals:   12/24/16 0522 12/24/16 1817 12/24/16 2348 12/25/16 0700  BP: (!) 102/48 125/85 131/83 128/79  Pulse: (!) 105 93 93 98  Resp: 18 20 18 18   Temp: 98.4 F (36.9 C) 98.1 F (36.7 C) 98.3 F (36.8 C) 98.3 F (36.8 C)  TempSrc: Oral Oral Oral Oral  SpO2:      Weight:      Height:       General: alert and no distress Lochia: appropriate Uterine Fundus: firm Incision: Healing well with no significant drainage DVT Evaluation: No evidence of DVT seen on physical exam. Labs: Lab Results  Component Value Date   WBC 20.0 (H) 12/23/2016   HGB 8.8 (L) 12/23/2016   HCT 27.1 (L)  12/23/2016   MCV 82.9 12/23/2016   PLT 195 12/23/2016   CMP Latest Ref Rng & Units 12/22/2016  Glucose 65 - 99 mg/dL 111(H)  BUN 6 - 20 mg/dL 11  Creatinine 0.44 - 1.00 mg/dL 0.68  Sodium 135 - 145 mmol/L 134(L)  Potassium 3.5 - 5.1 mmol/L 3.5  Chloride 101 - 111 mmol/L 107  CO2 22 - 32 mmol/L 20(L)  Calcium 8.9 - 10.3 mg/dL 8.5(L)  Total Protein 6.5 - 8.1 g/dL 6.4(L)  Total Bilirubin 0.3 - 1.2 mg/dL 0.8  Alkaline Phos 38 - 126 U/L 132(H)  AST 15 - 41 U/L 20  ALT 14 - 54 U/L 16    Discharge instruction: per After Visit Summary and "Baby and Me Booklet".  After visit meds:  Allergies as of 12/25/2016      Reactions  Metronidazole Hives      Medication List    TAKE these medications   ibuprofen 800 MG tablet Commonly known as:  ADVIL,MOTRIN Take 1 tablet (800 mg total) by mouth every 8 (eight) hours.   oxyCODONE 5 MG immediate release tablet Commonly known as:  Oxy IR/ROXICODONE Take 1 tablet (5 mg total) by mouth every 6 (six) hours as needed for severe pain.   prenatal vitamin w/FE, FA 27-1 MG Tabs tablet Take 1 tablet by mouth daily.       Diet: routine diet  Activity: Advance as tolerated. Pelvic rest for 6 weeks.   Outpatient follow up:2 and 6 weeks Follow up Appt:No future appointments. Follow up Visit:No Follow-up on file.  Postpartum contraception: Undecided  Newborn Data: Live born female  Birth Weight: 9 lb 8.6 oz (4326 g) APGAR: 9, 9  Baby Feeding: Breast Disposition:home with mother   12/25/2016 Janyth Contes, MD

## 2016-12-26 ENCOUNTER — Encounter (HOSPITAL_COMMUNITY): Payer: Self-pay | Admitting: Emergency Medicine

## 2016-12-26 ENCOUNTER — Emergency Department (HOSPITAL_COMMUNITY): Payer: Medicaid Other

## 2016-12-26 ENCOUNTER — Inpatient Hospital Stay (HOSPITAL_COMMUNITY)
Admission: EM | Admit: 2016-12-26 | Discharge: 2016-12-29 | DRG: 776 | Disposition: A | Discharge status: Home / Self Care | Payer: Medicaid Other | Attending: Obstetrics and Gynecology | Admitting: Obstetrics and Gynecology

## 2016-12-26 DIAGNOSIS — O1495 Unspecified pre-eclampsia, complicating the puerperium: Secondary | ICD-10-CM

## 2016-12-26 DIAGNOSIS — O1415 Severe pre-eclampsia, complicating the puerperium: Secondary | ICD-10-CM | POA: Diagnosis present

## 2016-12-26 DIAGNOSIS — J811 Chronic pulmonary edema: Secondary | ICD-10-CM | POA: Diagnosis present

## 2016-12-26 DIAGNOSIS — R0902 Hypoxemia: Secondary | ICD-10-CM

## 2016-12-26 DIAGNOSIS — O1410 Severe pre-eclampsia, unspecified trimester: Secondary | ICD-10-CM | POA: Diagnosis present

## 2016-12-26 DIAGNOSIS — R0603 Acute respiratory distress: Secondary | ICD-10-CM

## 2016-12-26 DIAGNOSIS — R0602 Shortness of breath: Secondary | ICD-10-CM | POA: Diagnosis present

## 2016-12-26 DIAGNOSIS — I5030 Unspecified diastolic (congestive) heart failure: Secondary | ICD-10-CM | POA: Diagnosis not present

## 2016-12-26 DIAGNOSIS — I10 Essential (primary) hypertension: Secondary | ICD-10-CM

## 2016-12-26 HISTORY — DX: Unspecified pre-eclampsia, complicating the puerperium: O14.95

## 2016-12-26 LAB — COMPREHENSIVE METABOLIC PANEL
ALBUMIN: 3 g/dL — AB (ref 3.5–5.0)
ALT: 116 U/L — AB (ref 14–54)
AST: 102 U/L — AB (ref 15–41)
Alkaline Phosphatase: 130 U/L — ABNORMAL HIGH (ref 38–126)
Anion gap: 9 (ref 5–15)
BILIRUBIN TOTAL: 1.3 mg/dL — AB (ref 0.3–1.2)
BUN: 11 mg/dL (ref 6–20)
CO2: 24 mmol/L (ref 22–32)
CREATININE: 0.62 mg/dL (ref 0.44–1.00)
Calcium: 8.6 mg/dL — ABNORMAL LOW (ref 8.9–10.3)
Chloride: 105 mmol/L (ref 101–111)
GFR calc Af Amer: >60 mL/min (ref >60)
GFR calc non Af Amer: >60 mL/min (ref >60)
GLUCOSE: 90 mg/dL (ref 65–99)
Potassium: 3.8 mmol/L (ref 3.5–5.1)
SODIUM: 138 mmol/L (ref 135–145)
Total Protein: 7 g/dL (ref 6.5–8.1)

## 2016-12-26 LAB — BASIC METABOLIC PANEL
Anion gap: 7 (ref 5–15)
BUN: 10 mg/dL (ref 6–20)
CALCIUM: 8.7 mg/dL — AB (ref 8.9–10.3)
CO2: 23 mmol/L (ref 22–32)
Chloride: 107 mmol/L (ref 101–111)
Creatinine, Ser: 0.67 mg/dL (ref 0.44–1.00)
GFR calc Af Amer: >60 mL/min (ref >60)
GFR calc non Af Amer: >60 mL/min (ref >60)
GLUCOSE: 94 mg/dL (ref 65–99)
Potassium: 4.1 mmol/L (ref 3.5–5.1)
Sodium: 137 mmol/L (ref 135–145)

## 2016-12-26 LAB — CBC
HEMATOCRIT: 26.8 % — AB (ref 36.0–46.0)
Hemoglobin: 8.7 g/dL — ABNORMAL LOW (ref 12.0–15.0)
MCH: 26.9 pg (ref 26.0–34.0)
MCHC: 32.5 g/dL (ref 30.0–36.0)
MCV: 82.7 fL (ref 78.0–100.0)
PLATELETS: 191 10*3/uL (ref 150–400)
RBC: 3.24 MIL/uL — ABNORMAL LOW (ref 3.87–5.11)
RDW: 15.5 % (ref 11.5–15.5)
WBC: 11.8 10*3/uL — AB (ref 4.0–10.5)

## 2016-12-26 LAB — HEPATIC FUNCTION PANEL
ALT: 99 U/L — ABNORMAL HIGH (ref 14–54)
AST: 89 U/L — AB (ref 15–41)
Albumin: 2.7 g/dL — ABNORMAL LOW (ref 3.5–5.0)
Alkaline Phosphatase: 134 U/L — ABNORMAL HIGH (ref 38–126)
BILIRUBIN DIRECT: 0.2 mg/dL (ref 0.1–0.5)
BILIRUBIN INDIRECT: 0.9 mg/dL (ref 0.3–0.9)
BILIRUBIN TOTAL: 1.1 mg/dL (ref 0.3–1.2)
Total Protein: 6.1 g/dL — ABNORMAL LOW (ref 6.5–8.1)

## 2016-12-26 LAB — MAGNESIUM: Magnesium: 3.7 mg/dL — ABNORMAL HIGH (ref 1.7–2.4)

## 2016-12-26 MED ORDER — ONDANSETRON HCL 4 MG/2ML IJ SOLN
4.0000 mg | Freq: Four times a day (QID) | INTRAMUSCULAR | Status: DC | PRN
Start: 2016-12-26 — End: 2016-12-29

## 2016-12-26 MED ORDER — MAGNESIUM SULFATE 40 G IN LACTATED RINGERS - SIMPLE
2.0000 g/h | INTRAVENOUS | Status: DC
  Administered 2016-12-26 – 2016-12-27 (×3): 2 g/h via INTRAVENOUS
  Filled 2016-12-26: qty 40
  Filled 2016-12-26: qty 500

## 2016-12-26 MED ORDER — ALUM & MAG HYDROXIDE-SIMETH 200-200-20 MG/5ML PO SUSP
30.0000 mL | ORAL | Status: DC | PRN
  Administered 2016-12-28: 30 mL via ORAL
  Filled 2016-12-26: qty 30

## 2016-12-26 MED ORDER — MAGNESIUM SULFATE BOLUS VIA INFUSION
2.0000 g | Freq: Once | INTRAVENOUS | Status: DC
  Filled 2016-12-26: qty 500

## 2016-12-26 MED ORDER — ONDANSETRON HCL 4 MG PO TABS: 4.0000 mg | ORAL_TABLET | Freq: Four times a day (QID) | ORAL | Status: DC | PRN

## 2016-12-26 MED ORDER — MAGNESIUM SULFATE 40 G IN LACTATED RINGERS - SIMPLE
2.0000 g/h | INTRAVENOUS | Status: DC
Start: 2016-12-26 — End: 2016-12-26
  Filled 2016-12-26: qty 500

## 2016-12-26 MED ORDER — NITROGLYCERIN 2 % TD OINT: 1.0000 [in_us] | TOPICAL_OINTMENT | Freq: Once | TRANSDERMAL | Status: DC

## 2016-12-26 MED ORDER — SIMETHICONE 80 MG PO CHEW: 80.0000 mg | CHEWABLE_TABLET | Freq: Four times a day (QID) | ORAL | Status: DC | PRN

## 2016-12-26 MED ORDER — HYDRALAZINE HCL 20 MG/ML IJ SOLN: 10.0000 mg | Freq: Once | INTRAMUSCULAR | Status: DC | PRN

## 2016-12-26 MED ORDER — OXYCODONE-ACETAMINOPHEN 5-325 MG PO TABS
1.0000 | ORAL_TABLET | ORAL | Status: DC | PRN
  Administered 2016-12-27: 2 via ORAL
  Administered 2016-12-27 – 2016-12-29 (×6): 1 via ORAL
  Filled 2016-12-26 (×4): qty 1
  Filled 2016-12-26: qty 2
  Filled 2016-12-26 (×2): qty 1

## 2016-12-26 MED ORDER — LACTATED RINGERS IV SOLN
INTRAVENOUS | Status: DC
  Administered 2016-12-26 – 2016-12-27 (×2): via INTRAVENOUS

## 2016-12-26 MED ORDER — MENTHOL 3 MG MT LOZG: 1.0000 | LOZENGE | OROMUCOSAL | Status: DC | PRN

## 2016-12-26 MED ORDER — SODIUM CHLORIDE 0.9 % IV SOLN
INTRAVENOUS | Status: DC
  Administered 2016-12-26: 10 mL/h via INTRAVENOUS

## 2016-12-26 MED ORDER — GUAIFENESIN 100 MG/5ML PO SOLN
15.0000 mL | ORAL | Status: DC | PRN
  Administered 2016-12-27 – 2016-12-28 (×2): 300 mg via ORAL
  Filled 2016-12-26 (×2): qty 15

## 2016-12-26 MED ORDER — IBUPROFEN 800 MG PO TABS
800.0000 mg | ORAL_TABLET | Freq: Three times a day (TID) | ORAL | Status: DC | PRN
  Administered 2016-12-27 – 2016-12-29 (×2): 800 mg via ORAL
  Filled 2016-12-26 (×2): qty 1

## 2016-12-26 MED ORDER — FUROSEMIDE 10 MG/ML IJ SOLN
20.0000 mg | Freq: Once | INTRAMUSCULAR | Status: AC
  Administered 2016-12-26: 20 mg via INTRAVENOUS
  Filled 2016-12-26: qty 2

## 2016-12-26 MED ORDER — MAGNESIUM SULFATE BOLUS VIA INFUSION
4.0000 g | Freq: Once | INTRAVENOUS | Status: AC
  Administered 2016-12-26: 4 g via INTRAVENOUS
  Filled 2016-12-26: qty 500

## 2016-12-26 MED ORDER — LABETALOL HCL 5 MG/ML IV SOLN: 20.0000 mg | INTRAVENOUS | Status: DC | PRN

## 2016-12-26 NOTE — ED Notes (Signed)
CALLED [facility] 

## 2016-12-26 NOTE — ED Notes (Signed)
[facility] called for transport to [facility]

## 2016-12-26 NOTE — ED Provider Notes (Signed)
[place] DEPT Provider Note   CSN: [id number] Arrival date & time: 12/26/16  1642     History   Chief Complaint Chief Complaint  Patient presents with  . Respiratory Distress    HPI Crystal Hudson is a 36 y.o. female.  The history is provided by the patient, medical records and the EMS personnel.  Shortness of Breath  This is a new problem. The problem occurs continuously.The current episode started more than 2 days ago. The problem has been rapidly worsening (over last 24hrs). Associated symptoms include cough and leg swelling. Pertinent negatives include no fever, no sore throat, no ear pain, no chest pain, no vomiting, no abdominal pain and no rash. It is unknown what precipitated the problem. Risk factors: 4days post-partum. Treatments tried: BiPAP and magnesium. The treatment provided moderate relief. She has had prior hospitalizations.    Past Medical History:  Diagnosis Date  . Medical history non-contributory   . Post term pregnancy over 40 weeks 12/21/2016  . S/P cesarean section 12/22/2016  . Vaginal Pap smear, abnormal     Patient Active Problem List   Diagnosis Date Noted  . Term pregnancy 12/22/2016  . S/P cesarean section 12/22/2016  . Post term pregnancy over 40 weeks 12/21/2016    Past Surgical History:  Procedure Laterality Date  . CERVICAL BIOPSY    . CESAREAN SECTION N/A 12/22/2016   Procedure: CESAREAN SECTION;  Surgeon: Janyth Contes, MD;  Location: [facility];  Service: Obstetrics;  Laterality: N/A;  . DILATION AND CURETTAGE OF UTERUS      OB History    Gravida Para Term Preterm AB Living   3 1 1   2 1    SAB TAB Ectopic Multiple Live Births     2   0 1       Home Medications    Prior to Admission medications   Medication Sig Start Date End Date Taking? Authorizing Provider  ibuprofen (ADVIL,MOTRIN) 800 MG tablet Take 1 tablet (800 mg total) by mouth every 8 (eight) hours. 12/25/16   Jody Bovard-Stuckert, MD  oxyCODONE  (OXY IR/ROXICODONE) 5 MG immediate release tablet Take 1 tablet (5 mg total) by mouth every 6 (six) hours as needed for severe pain. 12/25/16   Janyth Contes, MD  prenatal vitamin w/FE, FA (PRENATAL 1 + 1) 27-1 MG TABS tablet Take 1 tablet by mouth daily. 12/25/16   Janyth Contes, MD    Family History Family History  Problem Relation Age of Onset  . Diabetes Father   . Hypertension Father   . Hypertension Maternal Aunt   . Hypertension Maternal Uncle     Social History Social History  Substance Use Topics  . Smoking status: Never Smoker  . Smokeless tobacco: Never Used  . Alcohol use Yes     Comment: socially     Allergies   Metronidazole   Review of Systems Review of Systems  Constitutional: Negative for chills and fever.  HENT: Negative for ear pain and sore throat.   Eyes: Negative for pain and visual disturbance.  Respiratory: Positive for cough and shortness of breath.   Cardiovascular: Positive for leg swelling. Negative for chest pain and palpitations.  Gastrointestinal: Negative for abdominal pain and vomiting.  Genitourinary: Negative for dysuria and hematuria.  Musculoskeletal: Negative for arthralgias and back pain.  Skin: Negative for color change and rash.  Neurological: Negative for seizures and syncope.  All other systems reviewed and are negative.    Physical Exam Updated Vital Signs  BP (!) 157/98   Pulse (!) 104   Resp (!) 30   Ht 5\' 3"  (1.6 m)   Wt 114.8 kg   LMP 03/14/2016   SpO2 100%   BMI 44.82 kg/m   Physical Exam  Constitutional: She is oriented to person, place, and time. She appears well-developed and well-nourished. No distress.  HENT:  Head: Normocephalic and atraumatic.  Eyes: Conjunctivae are normal.  Neck: Neck supple.  Cardiovascular: Regular rhythm.   No murmur heard. Tachycardic  Pulmonary/Chest: She is in respiratory distress.  Tachypnea, crackles throughout  Abdominal: Soft. There is no tenderness.    Musculoskeletal: She exhibits edema (tense b/l LE edema).  Neurological: She is alert and oriented to person, place, and time.  Skin: Skin is warm and dry.  Psychiatric: She has a normal mood and affect.  Nursing note and vitals reviewed.    ED Treatments / Results  Labs (all labs ordered are listed, but only abnormal results are displayed) Labs Reviewed  CBC - Abnormal; Notable for the following:       Result Value   WBC 11.8 (*)    RBC 3.24 (*)    Hemoglobin 8.7 (*)    HCT 26.8 (*)    All other components within normal limits  BASIC METABOLIC PANEL - Abnormal; Notable for the following:    Calcium 8.7 (*)    All other components within normal limits  HEPATIC FUNCTION PANEL - Abnormal; Notable for the following:    Total Protein 6.1 (*)    Albumin 2.7 (*)    AST 89 (*)    ALT 99 (*)    Alkaline Phosphatase 134 (*)    All other components within normal limits    EKG  EKG Interpretation  Date/Time:  Saturday December 26 2016 16:49:31 EDT Ventricular Rate:  113 PR Interval:    QRS Duration: 71 QT Interval:  319 QTC Calculation: 438 R Axis:   69 Text Interpretation:  Sinus tachycardia No previous tracing Confirmed by Ashok Cordia  MD, Lennette Bihari ([id number]) on 12/26/2016 5:33:15 PM       Radiology Dg Chest Port 1 View  Result Date: 12/26/2016 CLINICAL DATA:  Short of breath. Recent Cesarean section 4 days ago. EXAM: PORTABLE CHEST 1 VIEW COMPARISON:  None. FINDINGS: Mildly degraded exam due to AP portable technique and patient body habitus. Midline trachea. Cardiomegaly accentuated by AP portable technique. No definite pleural fluid. Probable lower lobe predominant airspace disease, greater on the right. Concurrent mild interstitial prominence. IMPRESSION: No active disease. Electronically Signed   By: Abigail Miyamoto M.D.   On: 12/26/2016 17:34    Procedures Procedures (including critical care time)  Medications Ordered in ED Medications  0.9 %  sodium chloride infusion (  Intravenous Transfusing/Transfer 12/26/16 1917)  magnesium sulfate 40 grams in LR 500 mL OB infusion (2 g/hr Intravenous Transfusing/Transfer 12/26/16 1917)  nitroGLYCERIN (NITROGLYN) 2 % ointment 1 inch (0 inches Topical Hold 12/26/16 1832)  magnesium bolus via infusion 4 g (4 g Intravenous Bolus from Bag 12/26/16 1700)     Initial Impression / Assessment and Plan / ED Course  I have reviewed the triage vital signs and the nursing notes.  Pertinent labs & imaging results that were available during my care of the patient were reviewed by me and considered in my medical decision making (see chart for details).    Pt with h/o uncomplicated c-section 4days ago presents with SOB, cough, leg swelling. Says she felt some SOB when she was d/c'd  after delivery, but yesterday it started to worsen; she also notes that she's had a dry cough and worsening of her b/l LE swelling. Denies F/C, HA, lightheadedness, syncope, CP, N/V, urinary symptoms. Called 911 today & [facility] responded first, finding the Pt w/SaO2 78% & SBP ~293mmHg; when EMS arrived, they placed her on NIPPV & gave her 2g magnesium prior to transport.  VS & exam as above. 4g magnesium ordered on arrival. EKG: ST @ 113 w/o signs of ischemia. CXR without consolidation or obvious effusions. Labs pending.  Discussed Pt's care with her delivering physician (Dr. Richrd Prime) & she is recommending transfer to [facility] for further treatment.   Discussed the plan w/the Pt and husband at the bedside; all questions answered to their satisfaction. EMTALA completed.  In stable condition at the time of transfer.  Final Clinical Impressions(s) / ED Diagnoses   Final diagnoses:  Respiratory distress  Hypoxia  Hypertension, unspecified type    New Prescriptions New Prescriptions   No medications on file     Jenny Reichmann, MD 12/26/16 1927    Lajean Saver, MD 12/26/16 2017

## 2016-12-26 NOTE — ED Notes (Signed)
EMS gave pt 2 grams of magnesium PTA

## 2016-12-26 NOTE — Progress Notes (Signed)
Mag level 3.7 MD made aware. Order received to keep infusion rate at 2g/hr.

## 2016-12-26 NOTE — ED Notes (Signed)
Magnesium 4 grams bolus infusing at this time

## 2016-12-26 NOTE — ED Notes (Signed)
[facility] at bedside, prepping for transport

## 2016-12-26 NOTE — Procedures (Signed)
On arrival to ED pt on cpap, pt placed on Bipap at this time and is tolerating well.  RT will monitor.

## 2016-12-26 NOTE — H&P (Addendum)
Crystal Hudson is a 36 y.o. female [id number] s/p LTCS with severe PreEclampsia - SOB and pulmonary edema.  Pt discharged yesterday after LTCS 4/10.  Failed IOL for 40+ and elevated BP.  Pressures well-controlled after delivery.  Priot to delivery pt w/o sx's of PreE.  Mother states pt had some SOB yesterday and especially this am.  Went by EMS to [facility], pulse Ox 75%, initially on bipap.    OB History    Gravida Para Term Preterm AB Living   3 1 1   2 1    SAB TAB Ectopic Multiple Live Births     2   0 1    TAB, SAB LTCS 40+ after failed IOL, 9#11 12/22/16 female  Pap WNL 3/17 + STD  Past Medical History:  Diagnosis Date  . Medical history non-contributory   . Post term pregnancy over 40 weeks 12/21/2016  . Preeclampsia in postpartum period 12/26/2016  . S/P cesarean section 12/22/2016  . Vaginal Pap smear, abnormal    Past Surgical History:  Procedure Laterality Date  . CERVICAL BIOPSY    . CESAREAN SECTION N/A 12/22/2016   Procedure: CESAREAN SECTION;  Surgeon: Janyth Contes, MD;  Location: [place];  Service: Obstetrics;  Laterality: N/A;  . DILATION AND CURETTAGE OF UTERUS     Family History: family history includes Diabetes in her father; Hypertension in her father, maternal aunt, and maternal uncle. Social History:  reports that she has never smoked. She has never used smokeless tobacco. She reports that she drinks alcohol. She reports that she uses drugs, including Marijuana.   Meds percocet, Motrin and PNV All NKDA   Review of Systems  HENT: Negative.   Eyes: Positive for blurred vision.  Respiratory: Positive for shortness of breath.   Cardiovascular: Negative.   Gastrointestinal: Positive for abdominal pain.  Genitourinary: Negative.   Musculoskeletal: Negative.   Skin: Negative.   Neurological: Positive for weakness.  Psychiatric/Behavioral: Negative.    History   Blood pressure (!) 162/101, pulse (!) 102, resp. rate (!) 29, height 5\' 3"  (1.6 m), weight  114.8 kg (253 lb), last menstrual period 03/14/2016, SpO2 100 %, unknown if currently breastfeeding. Maternal Exam:  Abdomen: Patient reports generalized tenderness.  Surgical scars: low transverse.     Physical Exam  Constitutional: She is oriented to person, place, and time. She appears distressed.  HENT:  Head: Normocephalic and atraumatic.  Cardiovascular: Normal rate and regular rhythm.   Respiratory: She is in respiratory distress.  GI: Soft. Bowel sounds are normal. She exhibits no distension. There is generalized tenderness.  Musculoskeletal: Normal range of motion.  Neurological: She is alert and oriented to person, place, and time.  Skin: Skin is warm and dry.  Psychiatric: She has a normal mood and affect. Her behavior is normal.    Prenatal labs: ABO, Rh: --/--/B POS (04/10 0054) Antibody: NEG (04/10 0054) Rubella: Immune (09/07 0000) RPR: Non Reactive (04/10 0054)  HBsAg: Negative (09/07 0000)  HIV: Non-reactive (09/07 0000)  GBS: Negative (03/08 0000)   Assessment/Plan: 36yo [id number] POD#4 with PreE and pulmonary edema RT to manage bipap Lasix to diurese Admit to ob/hirisk Magnesium Sulfate for sz prophylaxsis Close monitoring Excellent UOP after Lasix 20 - 1700cc+   Miyuki Rzasa Bovard-Stuckert 12/26/2016, 9:03 PM

## 2016-12-26 NOTE — Progress Notes (Signed)
27 F  Foley cath inserted with 10 cc NS. Patient tolerated procedure well. Clear yellow urine returned.

## 2016-12-26 NOTE — ED Triage Notes (Signed)
Pt  To ED via [facility] from home with c/o resp distress.  Pt is 4 days post c-section.   Pt st's when she was discharged from [facility] she was feeling short of breath but has gotten worse today.  On arrival to ED pt was on c-pap and was tolerating well.

## 2016-12-26 NOTE — Progress Notes (Signed)
Pt SOB, on BiPAP. Bi-Lat lungs coarse, Lasix 20 mg IV administered .

## 2016-12-27 ENCOUNTER — Inpatient Hospital Stay (HOSPITAL_COMMUNITY): Payer: Medicaid Other

## 2016-12-27 LAB — CBC
HEMATOCRIT: 27.7 % — AB (ref 36.0–46.0)
HEMOGLOBIN: 9 g/dL — AB (ref 12.0–15.0)
MCH: 26.9 pg (ref 26.0–34.0)
MCHC: 32.5 g/dL (ref 30.0–36.0)
MCV: 82.7 fL (ref 78.0–100.0)
Platelets: 189 10*3/uL (ref 150–400)
RBC: 3.35 MIL/uL — ABNORMAL LOW (ref 3.87–5.11)
RDW: 15.6 % — ABNORMAL HIGH (ref 11.5–15.5)
WBC: 11.4 10*3/uL — AB (ref 4.0–10.5)

## 2016-12-27 LAB — COMPREHENSIVE METABOLIC PANEL
ALK PHOS: 117 U/L (ref 38–126)
ALT: 208 U/L — ABNORMAL HIGH (ref 14–54)
ANION GAP: 8 (ref 5–15)
AST: 199 U/L — ABNORMAL HIGH (ref 15–41)
Albumin: 2.5 g/dL — ABNORMAL LOW (ref 3.5–5.0)
BUN: 10 mg/dL (ref 6–20)
CALCIUM: 7.7 mg/dL — AB (ref 8.9–10.3)
CO2: 25 mmol/L (ref 22–32)
Chloride: 103 mmol/L (ref 101–111)
Creatinine, Ser: 0.67 mg/dL (ref 0.44–1.00)
GFR calc non Af Amer: >60 mL/min (ref >60)
GLUCOSE: 96 mg/dL (ref 65–99)
Potassium: 3.8 mmol/L (ref 3.5–5.1)
Sodium: 136 mmol/L (ref 135–145)
TOTAL PROTEIN: 5.8 g/dL — AB (ref 6.5–8.1)
Total Bilirubin: 1.1 mg/dL (ref 0.3–1.2)

## 2016-12-27 LAB — MAGNESIUM: Magnesium: 4.8 mg/dL — ABNORMAL HIGH (ref 1.7–2.4)

## 2016-12-27 MED ORDER — FUROSEMIDE 20 MG PO TABS
20.0000 mg | ORAL_TABLET | Freq: Once | ORAL | Status: AC
  Administered 2016-12-27: 20 mg via ORAL
  Filled 2016-12-27: qty 1

## 2016-12-27 MED ORDER — MAGNESIUM SULFATE 40 G IN LACTATED RINGERS - SIMPLE
1.0000 g/h | INTRAVENOUS | Status: DC
  Filled 2016-12-27: qty 500

## 2016-12-27 MED ORDER — NITROFURANTOIN MACROCRYSTAL 100 MG PO CAPS
100.0000 mg | ORAL_CAPSULE | Freq: Every day | ORAL | Status: DC
  Administered 2016-12-27: 100 mg via ORAL
  Filled 2016-12-27 (×3): qty 1

## 2016-12-27 MED ORDER — HYDROCHLOROTHIAZIDE 12.5 MG PO CAPS
12.5000 mg | ORAL_CAPSULE | Freq: Two times a day (BID) | ORAL | Status: DC
  Administered 2016-12-27 – 2016-12-29 (×5): 12.5 mg via ORAL
  Filled 2016-12-27 (×7): qty 1

## 2016-12-27 NOTE — Progress Notes (Signed)
Patient ID: Crystal Hudson, female   DOB: Apr 11, 1981, 36 y.o.   MRN: [id number]  PreE with severe features 5 days postpartum Received Lasix 20mg  with excellent diuresis, essentially 10L Received Mg for sz prophylaxis - level this am minimally elevated cut rate to 1g/hr as had diuresed so well and h/o pulm edema Started HCTZ for diuresis and BP control, bid D/C Mg at 24 hr, watch overnight and d/c in AM - poss need to adjust BP meds. Lactation worked with pt this AM

## 2016-12-27 NOTE — Consult Note (Signed)
4 day post partum readmit for treatment of elevated B/P.  Mom is on magnesium and feeling sleepy and sad she is away from her baby.  RN initiated pumping this AM.  Reviewed importance of pumping 8-12 times/24 hours.  Mom states breasts are comfortable.  Encouraged to call with concerns/assist prn.

## 2016-12-27 NOTE — Progress Notes (Addendum)
Contact with Dr. Sandford Craze. Instructed to lower Magnesium Sulfate to 1gram per hour. Order give for PA and lateral chest x-ray. Pt transported with nurse to radiology.Toya Smothers, RN

## 2016-12-27 NOTE — Progress Notes (Signed)
Vag prep completed and Foley Cath inserted with 10 ml of NS as ordered. Patient tolerated procedure well.

## 2016-12-27 NOTE — Progress Notes (Signed)
Patient ID: Crystal Hudson, female   DOB: 1981-07-13, 36 y.o.   MRN: [id number]   Breathing more easily, weaned to RA with great sats.  HUGE diuresis (6700 cc)  AF VSS, BP 150's/low 100s, 6700 uop since admission  gen NAD CV RRR Lungs CTAB Abd soft, FFNT Inc C/D/I Ext sym, NT, edema  On Magnesium for sz prophylaxis Will add antihypertensive - HCTZ 12.5mg  bid  Close monitoring Regular diet

## 2016-12-27 NOTE — Progress Notes (Addendum)
Patient ID: Crystal Hudson, female   DOB: 01-18-1981, 36 y.o.   MRN: [id number]  Late entry:  D/W pt Magnesium x 24 hr - will d/c and monitor closely. Foley to be d/c'd Encourage ambulation Pt still with HA - from description likely from [place] Possible d/c in AM 3375cc diuresis today.   CXR significant improvement.    Addendum  Foley d/c'd, still SOB with walking to RR Have bedside commode will give 20mg  of Lasix po (CXR with increased perihilar markings and fluid) Replace foley overnight (as had such large diuresis last night and trouble getting to RR) Single dose of macrodantin LFTs also elevated will check CBC and CMP in AM

## 2016-12-27 NOTE — Progress Notes (Signed)
Furosemide 20 mg po administered as ordered. We will continue to monitor urine output.

## 2016-12-28 ENCOUNTER — Inpatient Hospital Stay (HOSPITAL_COMMUNITY): Payer: Medicaid Other

## 2016-12-28 DIAGNOSIS — I5030 Unspecified diastolic (congestive) heart failure: Secondary | ICD-10-CM

## 2016-12-28 LAB — CBC
HCT: 27.8 % — ABNORMAL LOW (ref 36.0–46.0)
Hemoglobin: 8.8 g/dL — ABNORMAL LOW (ref 12.0–15.0)
MCH: 26.6 pg (ref 26.0–34.0)
MCHC: 31.7 g/dL (ref 30.0–36.0)
MCV: 84 fL (ref 78.0–100.0)
PLATELETS: 202 10*3/uL (ref 150–400)
RBC: 3.31 MIL/uL — ABNORMAL LOW (ref 3.87–5.11)
RDW: 16.3 % — AB (ref 11.5–15.5)
WBC: 11.3 10*3/uL — AB (ref 4.0–10.5)

## 2016-12-28 LAB — COMPREHENSIVE METABOLIC PANEL
ALT: 170 U/L — ABNORMAL HIGH (ref 14–54)
AST: 85 U/L — AB (ref 15–41)
Albumin: 2.6 g/dL — ABNORMAL LOW (ref 3.5–5.0)
Alkaline Phosphatase: 97 U/L (ref 38–126)
Anion gap: 6 (ref 5–15)
BUN: 10 mg/dL (ref 6–20)
CALCIUM: 7.8 mg/dL — AB (ref 8.9–10.3)
CO2: 27 mmol/L (ref 22–32)
CREATININE: 0.74 mg/dL (ref 0.44–1.00)
Chloride: 103 mmol/L (ref 101–111)
GFR calc Af Amer: >60 mL/min (ref >60)
Glucose, Bld: 90 mg/dL (ref 65–99)
POTASSIUM: 3.8 mmol/L (ref 3.5–5.1)
SODIUM: 136 mmol/L (ref 135–145)
TOTAL PROTEIN: 6.1 g/dL — AB (ref 6.5–8.1)
Total Bilirubin: 0.9 mg/dL (ref 0.3–1.2)

## 2016-12-28 LAB — ECHOCARDIOGRAM COMPLETE
Height: 63 in
WEIGHTICAEL: 3816 [oz_av]

## 2016-12-28 MED ORDER — FUROSEMIDE 10 MG/ML IJ SOLN
40.0000 mg | Freq: Once | INTRAMUSCULAR | Status: AC
Start: 2016-12-28 — End: 2016-12-28
  Administered 2016-12-28: 40 mg via INTRAVENOUS
  Filled 2016-12-28: qty 4

## 2016-12-28 MED ORDER — FUROSEMIDE 10 MG/ML IJ SOLN
40.0000 mg | Freq: Once | INTRAMUSCULAR | Status: AC
  Administered 2016-12-28: 40 mg via INTRAVENOUS
  Filled 2016-12-28: qty 4

## 2016-12-28 MED ORDER — NIFEDIPINE ER OSMOTIC RELEASE 30 MG PO TB24
60.0000 mg | ORAL_TABLET | Freq: Every day | ORAL | Status: DC
  Administered 2016-12-28 – 2016-12-29 (×2): 60 mg via ORAL
  Filled 2016-12-28 (×2): qty 2

## 2016-12-28 NOTE — Progress Notes (Signed)
Called MD:  Reported patient's blood pressure and Echo results.

## 2016-12-28 NOTE — Progress Notes (Signed)
Called MD:  Patient has voided twice, is ambulating and the ECHO has been performed, results pending.

## 2016-12-28 NOTE — Progress Notes (Signed)
Called over to [facility] regarding Echo... They are aware of the order.

## 2016-12-28 NOTE — Progress Notes (Signed)
Patient refused Nitrofurantoin

## 2016-12-28 NOTE — Progress Notes (Signed)
Patient C/O epigastric pain, coughing,blurred vision headache and back pain while she continue to to access social media on her cell phone. We administered Guaifenesin 300 mg po and Maalox 30 mls po. We will inform MD.

## 2016-12-28 NOTE — Progress Notes (Signed)
Patient sitting up in bed eating, and taking on phone laughing.  Pain improved.

## 2016-12-28 NOTE — Progress Notes (Signed)
Reassess patient for medication effectiveness, pt reported " I'm waiting for it to work". We encouraged pt to let her nurse know if the pain dosen't get better. Pt stated that she would.. Dr Melba Coon is aware and we communicated x 3 about same.

## 2016-12-28 NOTE — Plan of Care (Signed)
Problem: Activity: Goal: Risk for activity intolerance will decrease Outcome: Progressing Pt continues to experience SOB and activity intolerance.   Comments: Pt continues to have SOB and activity  Intolerance, however she reported "feeling much better" since admission.

## 2016-12-28 NOTE — Progress Notes (Signed)
Patient denied pain at 0900, at [phone] patient is on phone, texting and looking at instagram, patient states that she has a pain of 7/7 in her stomach.

## 2016-12-28 NOTE — Progress Notes (Signed)
Post Partum Day 6 Readmission with pulmonary edema and hypoxia/pp preeclampsia Subjective: Pt states still noting some blurry vision and mid chest pain.  SOB improved but not resolved.  Has not ambulated yet this AM  Objective: Blood pressure (!) 147/85, pulse (!) 110, temperature 98.1 F (36.7 C), temperature source Oral, resp. rate (!) 22, height 5\' 3"  (1.6 m), weight 108.2 kg (238 lb 8 oz), last menstrual period 03/14/2016, SpO2 94 %, unknown if currently breastfeeding.  Physical Exam:  General: alert and cooperative  Lungs with crackles noted 1/2 way up lung fields but right > left Lochia: appropriate Uterine Fundus: firm Incision: C/D/I    Recent Labs  12/27/16 0530 12/28/16 0607  HGB 9.0* 8.8*  HCT 27.7* 27.8*    Assessment/Plan: Pt readmitted on pp day #5 with severe pulmonary edema and elevated LFT's, BP c/w pp preeclampsia.  Despite diuresing over 5 liters of fluid, still has some crackles and exertional SOB, will check ECHO to make sure no pp cardiomyopathy.  Will also add procardia for BP management.  Has foley in overnight for increased diuresis and will remove today if ambulating well.   LOS: 2 days   Logan Bores 12/28/2016, 8:27 AM

## 2016-12-28 NOTE — Progress Notes (Signed)
Patient ID: Crystal Hudson, female   DOB: 03-Dec-1980, 36 y.o.   MRN: [id number] Pt improving but still mildly SOB with talking and walking, able to ambulate to bathroom fine now  afeb BP improving 130-140/83-100 on Procardia O2 sats more consistently 98-99 3830ml UOP this shift  Abdomen soft LE edema still 2-3+ pitting  ECHO with normal ejection fraction, rules out pp cardiomyopathy  Pt Day 6 with pp preeclampsia and HELLP sx/Pulmonary edema Recheck LFT's in AM, Lasix again this PM Continue procardia XL 60mg  q AM If BP stable and SOB resolved in AM will consider d/c.

## 2016-12-28 NOTE — Progress Notes (Signed)
  Echocardiogram 2D Echocardiogram has been performed.  Tresa Res 12/28/2016, 3:26 PM

## 2016-12-28 NOTE — Progress Notes (Signed)
MD at bedside. 

## 2016-12-29 LAB — COMPREHENSIVE METABOLIC PANEL
ALK PHOS: 109 U/L (ref 38–126)
ALT: 117 U/L — AB (ref 14–54)
AST: 40 U/L (ref 15–41)
Albumin: 3.1 g/dL — ABNORMAL LOW (ref 3.5–5.0)
Anion gap: 7 (ref 5–15)
BUN: 13 mg/dL (ref 6–20)
CHLORIDE: 102 mmol/L (ref 101–111)
CO2: 29 mmol/L (ref 22–32)
CREATININE: 0.76 mg/dL (ref 0.44–1.00)
Calcium: 8.6 mg/dL — ABNORMAL LOW (ref 8.9–10.3)
GFR calc non Af Amer: >60 mL/min (ref >60)
GLUCOSE: 89 mg/dL (ref 65–99)
Potassium: 4 mmol/L (ref 3.5–5.1)
SODIUM: 138 mmol/L (ref 135–145)
Total Bilirubin: 1 mg/dL (ref 0.3–1.2)
Total Protein: 6.9 g/dL (ref 6.5–8.1)

## 2016-12-29 LAB — CBC
HCT: 30.8 % — ABNORMAL LOW (ref 36.0–46.0)
HEMOGLOBIN: 9.7 g/dL — AB (ref 12.0–15.0)
MCH: 26.4 pg (ref 26.0–34.0)
MCHC: 31.5 g/dL (ref 30.0–36.0)
MCV: 83.7 fL (ref 78.0–100.0)
PLATELETS: 232 10*3/uL (ref 150–400)
RBC: 3.68 MIL/uL — AB (ref 3.87–5.11)
RDW: 16.6 % — ABNORMAL HIGH (ref 11.5–15.5)
WBC: 10.9 10*3/uL — AB (ref 4.0–10.5)

## 2016-12-29 MED ORDER — NIFEDIPINE ER 60 MG PO TB24: 60.0000 mg | ORAL_TABLET | Freq: Every day | ORAL | 1 refills | Status: AC

## 2016-12-29 MED ORDER — HYDROCHLOROTHIAZIDE 12.5 MG PO CAPS: 12.5000 mg | ORAL_CAPSULE | ORAL | 1 refills | Status: AC

## 2016-12-29 MED ORDER — NITROFURANTOIN MACROCRYSTAL 100 MG PO CAPS: 100.0000 mg | ORAL_CAPSULE | Freq: Every day | ORAL | 0 refills | Status: AC

## 2016-12-29 NOTE — Discharge Instructions (Signed)
Nothing in vagina for 6 weeks.  No sex, tampons, and douching.  Other instructions as in [facility] Discharge Booklet. °

## 2016-12-29 NOTE — Progress Notes (Signed)
Discharge teaching complete. Pt understood all information and did not have any questions. Pt discharged home to family. 

## 2016-12-29 NOTE — Progress Notes (Signed)
Patient ID: Crystal Hudson, female   DOB: March 30, 1981, 36 y.o.   MRN: [id number] Pt doing well today. Reports no CP and SOB has improved significantly. Denies headache has some slight blurry vision since was on magnesium sulfate but improved as well. Able to ambulate well, lochia scant. She is pumping ; output is slightly decreased. Misses her son, Legend. Her mother and boyfriend are caring for him  VS 110-117/56-78,  110-127,  18-20, 98% RA ABD - removed dressing; steristrips in place            Incision c/d/I and well approximated EXT - +3 edema b/l with pitting   Labs: cmp wnl including lfts ( improved)           10.9>9.7<232            UOP 2135ml since midnight today  A/P: PPDay 7 s/p pltc/s with pp preeclampsia and         HELLP sx/Pulmonary edema - improved LFT's improved  Continues to diurese well Vitals improved; slight tachycardia remains Discussed discharge to home today - pt would like this Continue procardia XL 60mg  q AM; missed dose this am - will give now Pt to be seen in office on 4/19 for circumcisiona dn we can recheck BP then; incision check next week Routine pp visit at 6weeks.

## 2016-12-29 NOTE — Discharge Summary (Signed)
Physician Discharge Summary  Patient ID: Crystal Hudson MRN: [id number] DOB/AGE: 35-07-1981 35 y.o.  Admit date: 12/26/2016 Discharge date: 12/29/2016  Admission Diagnoses:  Discharge Diagnoses:  Principal Problem:   Preeclampsia in postpartum period Active Problems:   Preeclampsia, severe   Discharged Condition: stable  Hospital Course: Pt was readmitted a day after discharged home following pltc/s for failed iol. She was noted to have pp preclampsia and pulmonary edema. Pt was given Magnesium sulfate, started on procardia 60xl afterwards and diuresed copiously/ her symptoms improved significantly and has been deemed stable for discharge to home today. An echo and ekg were nl ; slight tachycardia noted  Consults: None and lactation  Significant Diagnostic Studies: echo - nl                                                        cxr - nl  Treatments: analgesia: ibuprofen/oxycodone and magnesium sulfate/ iv labetalol/procardia xl/hctz  Discharge Exam: Blood pressure 110/78, pulse (!) 127, temperature 98.2 F (36.8 C), temperature source Oral, resp. rate 18, height 5\' 3"  (1.6 m), weight 238 lb 8 oz (108.2 kg), SpO2 98 %, unknown if currently breastfeeding. General appearance: alert, cooperative and no distress Resp: clear to auscultation bilaterally Pelvic: external genitalia normal Extremities: +3 edema but no homans Incision/Wound:clear/dry/intact  Disposition: 01-Home or Self Care  Discharge Instructions    Activity as tolerated    Complete by:  As directed    Call MD for:  difficulty breathing, headache or visual disturbances    Complete by:  As directed    Call MD for:  extreme fatigue    Complete by:  As directed    Call MD for:  persistant dizziness or light-headedness    Complete by:  As directed    Call MD for:  persistant nausea and vomiting    Complete by:  As directed    Call MD for:  redness, tenderness, or signs of infection (pain, swelling, redness, odor or  green/yellow discharge around incision site)    Complete by:  As directed    Call MD for:  severe uncontrolled pain    Complete by:  As directed    Call MD for:  temperature >100.4    Complete by:  As directed    Diet - low sodium heart healthy    Complete by:  As directed    Discharge instructions    Complete by:  As directed    Nothing in vagina for 6 weeks.  No sex, tampons, and douching   Driving restriction     Complete by:  As directed    Avoid driving for at least 1-2 weeks; or while taking narcotic medication   Lifting restrictions    Complete by:  As directed    Weight restriction of 15 lbs.   Sexual acrtivity    Complete by:  As directed    Nothing in vagina for 6 weeks.  No sex, tampons, and douching     Allergies as of 12/29/2016      Reactions   Metronidazole Hives      Medication List    TAKE these medications   hydrochlorothiazide 12.5 MG capsule Commonly known as:  MICROZIDE Take 1 capsule (12.5 mg total) by mouth 2 (two) times a week. Start taking on:  12/31/2016  ibuprofen 800 MG tablet Commonly known as:  ADVIL,MOTRIN Take 1 tablet (800 mg total) by mouth every 8 (eight) hours.   NIFEdipine 60 MG 24 hr tablet Commonly known as:  PROCARDIA-XL/ADALAT CC Take 1 tablet (60 mg total) by mouth daily.   nitrofurantoin 100 MG capsule Commonly known as:  MACRODANTIN Take 1 capsule (100 mg total) by mouth at bedtime.   oxyCODONE 5 MG immediate release tablet Commonly known as:  Oxy IR/ROXICODONE Take 1 tablet (5 mg total) by mouth every 6 (six) hours as needed for severe pain.   prenatal vitamin w/FE, FA 27-1 MG Tabs tablet Take 1 tablet by mouth daily.      Follow-up Information    Janyth Contes, MD Follow up in 2 day(s).   Specialty:  Obstetrics and Gynecology Why:  Follow up in office in two days for BP check while getting circumcision; incision check in a week; routine postpartum visit at 6weeks Contact information: [place]  [id number] [phone]           Signed: Isaiah Serge 12/29/2016, 1:48 PM

## 2017-02-23 ENCOUNTER — Encounter (HOSPITAL_COMMUNITY): Payer: Self-pay

## 2017-02-23 ENCOUNTER — Emergency Department (HOSPITAL_COMMUNITY): Payer: Medicaid Other

## 2017-02-23 ENCOUNTER — Emergency Department (HOSPITAL_COMMUNITY)
Admission: EM | Admit: 2017-02-23 | Discharge: 2017-02-23 | Disposition: A | Discharge status: Home / Self Care | Payer: Medicaid Other | Attending: Emergency Medicine | Admitting: Emergency Medicine

## 2017-02-23 DIAGNOSIS — Z79899 Other long term (current) drug therapy: Secondary | ICD-10-CM | POA: Insufficient documentation

## 2017-02-23 DIAGNOSIS — J181 Lobar pneumonia, unspecified organism: Secondary | ICD-10-CM | POA: Insufficient documentation

## 2017-02-23 DIAGNOSIS — J189 Pneumonia, unspecified organism: Secondary | ICD-10-CM

## 2017-02-23 DIAGNOSIS — R600 Localized edema: Secondary | ICD-10-CM | POA: Insufficient documentation

## 2017-02-23 DIAGNOSIS — R52 Pain, unspecified: Secondary | ICD-10-CM | POA: Diagnosis present

## 2017-02-23 LAB — COMPREHENSIVE METABOLIC PANEL
ALBUMIN: 3.5 g/dL (ref 3.5–5.0)
ALT: 25 U/L (ref 14–54)
AST: 30 U/L (ref 15–41)
Alkaline Phosphatase: 78 U/L (ref 38–126)
Anion gap: 8 (ref 5–15)
BUN: 14 mg/dL (ref 6–20)
CHLORIDE: 109 mmol/L (ref 101–111)
CO2: 17 mmol/L — AB (ref 22–32)
CREATININE: 0.95 mg/dL (ref 0.44–1.00)
Calcium: 8.8 mg/dL — ABNORMAL LOW (ref 8.9–10.3)
GFR calc Af Amer: >60 mL/min (ref >60)
GFR calc non Af Amer: >60 mL/min (ref >60)
GLUCOSE: 158 mg/dL — AB (ref 65–99)
POTASSIUM: 3.1 mmol/L — AB (ref 3.5–5.1)
SODIUM: 134 mmol/L — AB (ref 135–145)
Total Bilirubin: 0.7 mg/dL (ref 0.3–1.2)
Total Protein: 6.9 g/dL (ref 6.5–8.1)

## 2017-02-23 LAB — BRAIN NATRIURETIC PEPTIDE: B Natriuretic Peptide: 39.5 pg/mL (ref 0.0–100.0)

## 2017-02-23 LAB — URINALYSIS, ROUTINE W REFLEX MICROSCOPIC
BACTERIA UA: NONE SEEN
BILIRUBIN URINE: NEGATIVE
GLUCOSE, UA: NEGATIVE mg/dL
Hgb urine dipstick: NEGATIVE
Ketones, ur: 5 mg/dL — AB
LEUKOCYTES UA: NEGATIVE
NITRITE: NEGATIVE
PROTEIN: 100 mg/dL — AB
Specific Gravity, Urine: 1.029 (ref 1.005–1.030)
pH: 5 (ref 5.0–8.0)

## 2017-02-23 LAB — CBC WITH DIFFERENTIAL/PLATELET
BASOS ABS: 0 10*3/uL (ref 0.0–0.1)
BASOS PCT: 0 %
EOS ABS: 0 10*3/uL (ref 0.0–0.7)
EOS PCT: 0 %
HCT: 35.4 % — ABNORMAL LOW (ref 36.0–46.0)
Hemoglobin: 11.1 g/dL — ABNORMAL LOW (ref 12.0–15.0)
LYMPHS PCT: 18 %
Lymphs Abs: 1.9 10*3/uL (ref 0.7–4.0)
MCH: 25.6 pg — ABNORMAL LOW (ref 26.0–34.0)
MCHC: 31.4 g/dL (ref 30.0–36.0)
MCV: 81.8 fL (ref 78.0–100.0)
Monocytes Absolute: 0.3 10*3/uL (ref 0.1–1.0)
Monocytes Relative: 3 %
Neutro Abs: 8.4 10*3/uL — ABNORMAL HIGH (ref 1.7–7.7)
Neutrophils Relative %: 79 %
PLATELETS: 284 10*3/uL (ref 150–400)
RBC: 4.33 MIL/uL (ref 3.87–5.11)
RDW: 16.2 % — ABNORMAL HIGH (ref 11.5–15.5)
WBC: 10.7 10*3/uL — AB (ref 4.0–10.5)

## 2017-02-23 LAB — I-STAT TROPONIN, ED: Troponin i, poc: 0.02 ng/mL (ref 0.00–0.08)

## 2017-02-23 MED ORDER — CEFTRIAXONE SODIUM 1 G IJ SOLR
1.0000 g | Freq: Once | INTRAMUSCULAR | Status: AC
  Administered 2017-02-23: 1 g via INTRAMUSCULAR
  Filled 2017-02-23: qty 10

## 2017-02-23 MED ORDER — AZITHROMYCIN 250 MG PO TABS
500.0000 mg | ORAL_TABLET | Freq: Once | ORAL | Status: AC
  Administered 2017-02-23: 500 mg via ORAL
  Filled 2017-02-23: qty 2

## 2017-02-23 MED ORDER — AZITHROMYCIN 250 MG PO TABS: 250.0000 mg | ORAL_TABLET | Freq: Every day | ORAL | 0 refills | Status: AC

## 2017-02-23 NOTE — ED Provider Notes (Signed)
[facility] DEPT Provider Note   CSN: [id number] Arrival date & time: 02/23/17  0015     History   Chief Complaint Chief Complaint  Patient presents with  . Generalized Body Aches    HPI Crystal Hudson is a 36 y.o. female.  This a 36 year old female who is 2 months postpartum she did have eclampsia during her pregnancy, presents now with night sweats.  Generalized myalgias, some swelling in her ankles and urinary frequency. She is breast-feeding.      Past Medical History:  Diagnosis Date  . Medical history non-contributory   . Post term pregnancy over 40 weeks 12/21/2016  . Preeclampsia in postpartum period 12/26/2016  . S/P cesarean section 12/22/2016  . Vaginal Pap smear, abnormal     Patient Active Problem List   Diagnosis Date Noted  . Preeclampsia in postpartum period 12/26/2016  . Preeclampsia, severe 12/26/2016  . Term pregnancy 12/22/2016  . S/P cesarean section 12/22/2016  . Post term pregnancy over 40 weeks 12/21/2016    Past Surgical History:  Procedure Laterality Date  . CERVICAL BIOPSY    . CESAREAN SECTION N/A 12/22/2016   Procedure: CESAREAN SECTION;  Surgeon: Janyth Contes, MD;  Location: [facility];  Service: Obstetrics;  Laterality: N/A;  . DILATION AND CURETTAGE OF UTERUS      OB History    Gravida Para Term Preterm AB Living   3 1 1   2 1    SAB TAB Ectopic Multiple Live Births     2   0 1       Home Medications    Prior to Admission medications   Medication Sig Start Date End Date Taking? Authorizing Provider  hydrochlorothiazide (MICROZIDE) 12.5 MG capsule Take 1 capsule (12.5 mg total) by mouth 2 (two) times a week. 12/31/16   Sherlyn Hay, DO  ibuprofen (ADVIL,MOTRIN) 800 MG tablet Take 1 tablet (800 mg total) by mouth every 8 (eight) hours. 12/25/16   Bovard-Stuckert, Jeral Fruit, MD  NIFEdipine (PROCARDIA-XL/ADALAT CC) 60 MG 24 hr tablet Take 1 tablet (60 mg total) by mouth daily. 12/29/16   Sherlyn Hay,  DO  nitrofurantoin (MACRODANTIN) 100 MG capsule Take 1 capsule (100 mg total) by mouth at bedtime. 12/29/16   Banga, Bonnee Quin, DO  oxyCODONE (OXY IR/ROXICODONE) 5 MG immediate release tablet Take 1 tablet (5 mg total) by mouth every 6 (six) hours as needed for severe pain. 12/25/16   Bovard-Stuckert, Jeral Fruit, MD  prenatal vitamin w/FE, FA (PRENATAL 1 + 1) 27-1 MG TABS tablet Take 1 tablet by mouth daily. 12/25/16   Bovard-StuckertJeral Fruit, MD    Family History Family History  Problem Relation Age of Onset  . Diabetes Father   . Hypertension Father   . Hypertension Maternal Aunt   . Hypertension Maternal Uncle     Social History Social History  Substance Use Topics  . Smoking status: Never Smoker  . Smokeless tobacco: Never Used  . Alcohol use Yes     Comment: socially     Allergies   Metronidazole   Review of Systems Review of Systems  Constitutional: Positive for diaphoresis. Negative for fever.  Respiratory: Negative for cough and shortness of breath.   Cardiovascular: Positive for leg swelling. Negative for chest pain.  Genitourinary: Positive for frequency. Negative for dysuria.  Musculoskeletal: Positive for myalgias.  Neurological: Negative for dizziness and headaches.  All other systems reviewed and are negative.    Physical Exam Updated Vital Signs BP 114/73 (BP Location: Right  Arm)   Pulse 86   Temp 98.8 F (37.1 C) (Oral)   Resp 18   Ht 5\' 3"  (1.6 m)   Wt 93 kg (205 lb)   SpO2 99%   BMI 36.31 kg/m   Physical Exam  Constitutional: She appears well-developed and well-nourished. No distress.  HENT:  Head: Normocephalic.  Eyes: Pupils are equal, round, and reactive to light.  Neck: Normal range of motion.  Cardiovascular: Normal rate.   Pulmonary/Chest: Effort normal and breath sounds normal. No respiratory distress. She has no wheezes. She has no rales.  Abdominal: Soft.  Musculoskeletal: Normal range of motion.  Minimal edema in the lower legs    Neurological: She is alert.  Skin: Skin is warm.  Psychiatric: She has a normal mood and affect.  Nursing note and vitals reviewed.    ED Treatments / Results  Labs (all labs ordered are listed, but only abnormal results are displayed) Labs Reviewed  CBC WITH DIFFERENTIAL/PLATELET - Abnormal; Notable for the following:       Result Value   WBC 10.7 (*)    Hemoglobin 11.1 (*)    HCT 35.4 (*)    MCH 25.6 (*)    RDW 16.2 (*)    Neutro Abs 8.4 (*)    All other components within normal limits  COMPREHENSIVE METABOLIC PANEL - Abnormal; Notable for the following:    Sodium 134 (*)    Potassium 3.1 (*)    CO2 17 (*)    Glucose, Bld 158 (*)    Calcium 8.8 (*)    All other components within normal limits  BRAIN NATRIURETIC PEPTIDE  URINALYSIS, ROUTINE W REFLEX MICROSCOPIC  I-STAT TROPOININ, ED    EKG  EKG Interpretation  Date/Time:  Tuesday February 23 2017 00:45:45 EDT Ventricular Rate:  96 PR Interval:  132 QRS Duration: 68 QT Interval:  352 QTC Calculation: 444 R Axis:   76 Text Interpretation:  Normal sinus rhythm Right atrial enlargement Nonspecific T wave abnormality Abnormal ECG Confirmed by Thayer Jew [phone]) on 02/23/2017 4:58:18 AM       Radiology Dg Chest 2 View  Result Date: 02/23/2017 CLINICAL DATA:  Acute onset of increased urinary output, body aches and shortness of breath. Diaphoresis. Initial encounter. EXAM: CHEST  2 VIEW COMPARISON:  Chest radiograph performed 12/27/2016 FINDINGS: The lungs are well-aerated. Left basilar airspace opacity likely reflects pneumonia, though mild asymmetric interstitial edema might have a similar appearance. There is no evidence of pleural effusion or pneumothorax. The heart is normal in size; the mediastinal contour is within normal limits. No acute osseous abnormalities are seen. IMPRESSION: Left basilar airspace opacity likely reflects pneumonia, though mild asymmetric interstitial edema might have a similar appearance.  Electronically Signed   By: Garald Balding M.D.   On: 02/23/2017 01:27    Procedures Procedures (including critical care time)  Medications Ordered in ED Medications  azithromycin (ZITHROMAX) tablet 500 mg (not administered)  cefTRIAXone (ROCEPHIN) injection 1 g (not administered)     Initial Impression / Assessment and Plan / ED Course  I have reviewed the triage vital signs and the nursing notes.  Pertinent labs & imaging results that were available during my care of the patient were reviewed by me and considered in my medical decision making (see chart for details).      Chest x-ray shows that she has most likely has pneumonia her symptoms fit with that diagnosis of night sweats, myalgias, fatigue. Will start IM Rocephin and by mouth azithromycin,  which are both safe in lactating women while I await urine results  Final Clinical Impressions(s) / ED Diagnoses   Final diagnoses:  None    New Prescriptions New Prescriptions   No medications on file     Junius Creamer, NP 02/23/17 8412    Merryl Hacker, MD 02/23/17 [phone]

## 2017-02-23 NOTE — ED Triage Notes (Signed)
Recent IUD on 7th June

## 2017-02-23 NOTE — ED Triage Notes (Signed)
Pt here post partum since April 10th, reprots increased urine out put, body aches, black lines in fingers and blisters, sts had preeclampsia during pregnancy, report possible fluid in lungs. sts night sweats.

## 2017-02-23 NOTE — Discharge Instructions (Signed)
Today your evaluated for generalized myalgias.  Your blood work shows normal blood count, normal kidney function.  Your urine shows no sign of infection.  Your chest x-ray shows that you have pneumonia U been started on antibiotic.  Please take this as directed starting Wednesday morning negative, but with your primary care physician for follow-up

## 2017-03-25 NOTE — Anesthesia Postprocedure Evaluation (Signed)
Anesthesia Post Note  Patient: Crystal Hudson  Procedure(s) Performed: Procedure(s) (LRB): CESAREAN SECTION (N/A)     Anesthesia Post Evaluation  Last Vitals:  Vitals:   12/24/16 2348 12/25/16 0700  BP: 131/83 128/79  Pulse: 93 98  Resp: 18 18  Temp: 36.8 C 36.8 C    Last Pain:  Vitals:   12/25/16 1115  TempSrc:   PainSc: 2                  Montez Hageman

## 2017-03-25 NOTE — Addendum Note (Signed)
Addendum  created 03/25/17 1424 by Montez Hageman, MD   Sign clinical note

## 2017-06-08 ENCOUNTER — Emergency Department (HOSPITAL_COMMUNITY): Payer: Medicaid Other

## 2017-06-08 ENCOUNTER — Emergency Department (HOSPITAL_COMMUNITY)
Admission: EM | Admit: 2017-06-08 | Discharge: 2017-06-08 | Disposition: A | Discharge status: Home / Self Care | Payer: Medicaid Other | Attending: Emergency Medicine | Admitting: Emergency Medicine

## 2017-06-08 DIAGNOSIS — J029 Acute pharyngitis, unspecified: Secondary | ICD-10-CM | POA: Diagnosis present

## 2017-06-08 DIAGNOSIS — R112 Nausea with vomiting, unspecified: Secondary | ICD-10-CM | POA: Diagnosis not present

## 2017-06-08 DIAGNOSIS — R05 Cough: Secondary | ICD-10-CM | POA: Diagnosis not present

## 2017-06-08 DIAGNOSIS — Z79899 Other long term (current) drug therapy: Secondary | ICD-10-CM | POA: Diagnosis not present

## 2017-06-08 DIAGNOSIS — B349 Viral infection, unspecified: Secondary | ICD-10-CM | POA: Diagnosis not present

## 2017-06-08 DIAGNOSIS — R197 Diarrhea, unspecified: Secondary | ICD-10-CM | POA: Insufficient documentation

## 2017-06-08 LAB — CBC
HEMATOCRIT: 41.3 % (ref 36.0–46.0)
Hemoglobin: 13 g/dL (ref 12.0–15.0)
MCH: 25.9 pg — ABNORMAL LOW (ref 26.0–34.0)
MCHC: 31.5 g/dL (ref 30.0–36.0)
MCV: 82.3 fL (ref 78.0–100.0)
PLATELETS: 307 10*3/uL (ref 150–400)
RBC: 5.02 MIL/uL (ref 3.87–5.11)
RDW: 16.1 % — ABNORMAL HIGH (ref 11.5–15.5)
WBC: 14 10*3/uL — AB (ref 4.0–10.5)

## 2017-06-08 LAB — URINALYSIS, ROUTINE W REFLEX MICROSCOPIC
Bilirubin Urine: NEGATIVE
GLUCOSE, UA: NEGATIVE mg/dL
HGB URINE DIPSTICK: NEGATIVE
Ketones, ur: NEGATIVE mg/dL
LEUKOCYTES UA: NEGATIVE
Nitrite: NEGATIVE
Protein, ur: 30 mg/dL — AB
SPECIFIC GRAVITY, URINE: 1.029 (ref 1.005–1.030)
pH: 5 (ref 5.0–8.0)

## 2017-06-08 LAB — COMPREHENSIVE METABOLIC PANEL
ALT: 15 U/L (ref 14–54)
AST: 18 U/L (ref 15–41)
Albumin: 3.5 g/dL (ref 3.5–5.0)
Alkaline Phosphatase: 84 U/L (ref 38–126)
Anion gap: 6 (ref 5–15)
BUN: 10 mg/dL (ref 6–20)
CHLORIDE: 107 mmol/L (ref 101–111)
CO2: 22 mmol/L (ref 22–32)
Calcium: 8.7 mg/dL — ABNORMAL LOW (ref 8.9–10.3)
Creatinine, Ser: 0.81 mg/dL (ref 0.44–1.00)
Glucose, Bld: 106 mg/dL — ABNORMAL HIGH (ref 65–99)
POTASSIUM: 3.8 mmol/L (ref 3.5–5.1)
Sodium: 135 mmol/L (ref 135–145)
Total Bilirubin: 1.1 mg/dL (ref 0.3–1.2)
Total Protein: 7.5 g/dL (ref 6.5–8.1)

## 2017-06-08 LAB — RAPID STREP SCREEN (MED CTR MEBANE ONLY): STREPTOCOCCUS, GROUP A SCREEN (DIRECT): NEGATIVE

## 2017-06-08 LAB — LIPASE, BLOOD: LIPASE: 33 U/L (ref 11–51)

## 2017-06-08 MED ORDER — ONDANSETRON 4 MG PO TBDP: 4.0000 mg | ORAL_TABLET | Freq: Three times a day (TID) | ORAL | 0 refills | Status: AC | PRN

## 2017-06-08 NOTE — ED Provider Notes (Signed)
[facility] DEPT Provider Note   CSN: [id number] Arrival date & time: 06/08/17  1248     History   Chief Complaint Chief Complaint  Patient presents with  . Sore Throat  . Emesis  . Diarrhea    HPI Crystal Hudson is a 36 y.o. female.  HPI   Crystal Hudson is a 36 y.o. female, with a history of Pneumonia, presenting to the ED with sore throat and productive cough with yellow sputum beginning two days ago.  Also endorses nausea, vomiting, diarrhea, body aches, and chills beginning yesterday. Sore throat is bilateral, 8/10, scratchy and dry, nonradiating.   Delivered a baby in April 2018. Baby has URI. Patient is breastfeeding and formula feeding.   Denies SOB, CP, abdominal pain, hematochezia/melena, urinary symptoms, headache, difficulty swallowing or talking, or any other complaints.  Past Medical History:  Diagnosis Date  . Medical history non-contributory   . Post term pregnancy over 40 weeks 12/21/2016  . Preeclampsia in postpartum period 12/26/2016  . S/P cesarean section 12/22/2016  . Vaginal Pap smear, abnormal     Patient Active Problem List   Diagnosis Date Noted  . Preeclampsia in postpartum period 12/26/2016  . Preeclampsia, severe 12/26/2016  . Term pregnancy 12/22/2016  . S/P cesarean section 12/22/2016  . Post term pregnancy over 40 weeks 12/21/2016    Past Surgical History:  Procedure Laterality Date  . CERVICAL BIOPSY    . CESAREAN SECTION N/A 12/22/2016   Procedure: CESAREAN SECTION;  Surgeon: Janyth Contes, MD;  Location: [facility];  Service: Obstetrics;  Laterality: N/A;  . DILATION AND CURETTAGE OF UTERUS      OB History    Gravida Para Term Preterm AB Living   3 1 1   2 1    SAB TAB Ectopic Multiple Live Births     2   0 1       Home Medications    Prior to Admission medications   Medication Sig Start Date End Date Taking? Authorizing Provider  ibuprofen (ADVIL,MOTRIN) 800 MG tablet Take 1 tablet (800 mg total) by mouth  every 8 (eight) hours. 12/25/16  Yes Bovard-Stuckert, Jody, MD  levonorgestrel (MIRENA, 52 MG,) 20 MCG/24HR IUD Inject 20 mcg into the skin continuous. Done on August 6th   Yes [provider]  prenatal vitamin w/FE, FA (PRENATAL 1 + 1) 27-1 MG TABS tablet Take 1 tablet by mouth daily. Patient taking differently: Take 1 tablet by mouth daily. gummie 12/25/16  Yes Bovard-Stuckert, Jody, MD  azithromycin (ZITHROMAX) 250 MG tablet Take 1 tablet (250 mg total) by mouth daily. Patient not taking: Reported on 06/08/2017 02/23/17   Junius Creamer, NP  hydrochlorothiazide (MICROZIDE) 12.5 MG capsule Take 1 capsule (12.5 mg total) by mouth 2 (two) times a week. Patient not taking: Reported on 06/08/2017 12/31/16   Sherlyn Hay, DO  NIFEdipine (PROCARDIA-XL/ADALAT CC) 60 MG 24 hr tablet Take 1 tablet (60 mg total) by mouth daily. Patient not taking: Reported on 06/08/2017 12/29/16   Sherlyn Hay, DO  nitrofurantoin (MACRODANTIN) 100 MG capsule Take 1 capsule (100 mg total) by mouth at bedtime. 12/29/16   Banga, Lorna Few Worema, DO  ondansetron (ZOFRAN ODT) 4 MG disintegrating tablet Take 1 tablet (4 mg total) by mouth every 8 (eight) hours as needed for nausea or vomiting. 06/08/17   Jesenya Bowditch C, PA-C  oxyCODONE (OXY IR/ROXICODONE) 5 MG immediate release tablet Take 1 tablet (5 mg total) by mouth every 6 (six) hours as needed for  severe pain. Patient not taking: Reported on 06/08/2017 12/25/16   Janyth Contes, MD    Family History Family History  Problem Relation Age of Onset  . Diabetes Father   . Hypertension Father   . Hypertension Maternal Aunt   . Hypertension Maternal Uncle     Social History Social History  Substance Use Topics  . Smoking status: Never Smoker  . Smokeless tobacco: Never Used  . Alcohol use Yes     Comment: socially     Allergies   Metronidazole   Review of Systems Review of Systems  Constitutional: Positive for chills and diaphoresis.    HENT: Positive for sore throat. Negative for drooling, trouble swallowing and voice change.   Respiratory: Positive for cough.   Cardiovascular: Negative for chest pain.  Gastrointestinal: Positive for diarrhea, nausea and vomiting. Negative for abdominal pain.  Musculoskeletal: Positive for myalgias.  Neurological: Negative for headaches.  All other systems reviewed and are negative.    Physical Exam Updated Vital Signs BP 95/75   Pulse (!) 116   Temp 98.9 F (37.2 C) (Oral)   Resp 18   Ht 5\' 3"  (1.6 m)   Wt 92.1 kg (203 lb)   SpO2 99%   BMI 35.96 kg/m   Physical Exam  Constitutional: She appears well-developed and well-nourished. No distress.  HENT:  Head: Normocephalic and atraumatic.  Mouth/Throat: Uvula is midline and mucous membranes are normal. Oropharyngeal exudate, posterior oropharyngeal edema and posterior oropharyngeal erythema present. No tonsillar abscesses.  Patient handles oral secretions without difficulty.  Eyes: Conjunctivae are normal.  Neck: Neck supple.  Cardiovascular: Regular rhythm, normal heart sounds and intact distal pulses.  Tachycardia present.   Mildly tachycardic.   Pulmonary/Chest: Effort normal and breath sounds normal. No respiratory distress.  No increased work of breathing. Patient speaks in full sentences without difficulty.  Abdominal: Soft. There is no tenderness. There is no guarding.  Musculoskeletal: She exhibits no edema.  Lymphadenopathy:    She has no cervical adenopathy.  Neurological: She is alert.  Skin: Skin is warm and dry. Capillary refill takes less than 2 seconds. She is not diaphoretic.  Psychiatric: She has a normal mood and affect. Her behavior is normal.  Nursing note and vitals reviewed.    ED Treatments / Results  Labs (all labs ordered are listed, but only abnormal results are displayed) Labs Reviewed  COMPREHENSIVE METABOLIC PANEL - Abnormal; Notable for the following:       Result Value   Glucose, Bld  106 (*)    Calcium 8.7 (*)    All other components within normal limits  CBC - Abnormal; Notable for the following:    WBC 14.0 (*)    MCH 25.9 (*)    RDW 16.1 (*)    All other components within normal limits  URINALYSIS, ROUTINE W REFLEX MICROSCOPIC - Abnormal; Notable for the following:    Protein, ur 30 (*)    Bacteria, UA RARE (*)    Squamous Epithelial / LPF 0-5 (*)    All other components within normal limits  RAPID STREP SCREEN (NOT AT [facility])  CULTURE, GROUP A STREP ([place])  LIPASE, BLOOD    EKG  EKG Interpretation None       Radiology Dg Chest 2 View  Result Date: 06/08/2017 CLINICAL DATA:  Productive cough x3 days with dyspnea at night. EXAM: CHEST  2 VIEW COMPARISON:  02/23/2017 FINDINGS: The heart size and mediastinal contours are within normal limits. Both lungs are clear.  The visualized skeletal structures are unremarkable. IMPRESSION: No active cardiopulmonary disease. Electronically Signed   By: Ashley Royalty M.D.   On: 06/08/2017 22:45    Procedures Procedures (including critical care time)  Medications Ordered in ED Medications - No data to display   Initial Impression / Assessment and Plan / ED Course  I have reviewed the triage vital signs and the nursing notes.  Pertinent labs & imaging results that were available during my care of the patient were reviewed by me and considered in my medical decision making (see chart for details).     Patient presents with symptoms consistent with viral syndrome. Patient is nontoxic appearing, afebrile, not tachypneic, not hypotensive, maintains SPO2 of 98% on room air, and is in no apparent distress. Low suspicion for sepsis. PCP follow-up as needed. Resources given. Also discussed the need for switching to formula feeds for her infant while using medications such as Zofran, unless told otherwise by her OB/GYN or pediatrician. Tolerated PO. The patient was given instructions for home care as well as return precautions.  Patient voices understanding of these instructions, accepts the plan, and is comfortable with discharge.   Vitals:   06/08/17 2215 06/08/17 2245 06/08/17 2300 06/08/17 2330  BP: 105/70 107/75 115/74 107/75  Pulse: 98 (!) 110 (!) 101 (!) 109  Resp:    18  Temp:      TempSrc:      SpO2: 98% 98% 98% 99%  Weight:      Height:         Final Clinical Impressions(s) / ED Diagnoses   Final diagnoses:  Viral syndrome    New Prescriptions Discharge Medication List as of 06/08/2017 11:26 PM    START taking these medications   Details  ondansetron (ZOFRAN ODT) 4 MG disintegrating tablet Take 1 tablet (4 mg total) by mouth every 8 (eight) hours as needed for nausea or vomiting., Starting Tue 06/08/2017, Print         Nathaniel Yaden C, PA-C 06/08/17 2346    Sherwood Gambler, MD 06/10/17 1620

## 2017-06-08 NOTE — ED Triage Notes (Signed)
Pt reports 2-3 days sore throat, nausea, vomiting, diarrhea. Visible white patches noted to tonsils. Rapid strep obtained. VSS.

## 2017-06-08 NOTE — Discharge Instructions (Signed)
Are no signs of acute abnormalities on the chest x-ray, including no signs of pneumonia. Your symptoms are consistent with a viral illness. Viruses do not require antibiotics. Treatment is symptomatic care and it is important to note that these symptoms may last for 7-14 days.   Hand washing: Wash your hands throughout the day, but especially before and after touching the face, using the restroom, sneezing, coughing, or touching surfaces that have been coughed or sneezed upon. Hydration: Symptoms will be intensified and complicated by dehydration. Dehydration can also extend the duration of symptoms. Drink plenty of fluids and get plenty of rest. You should be drinking at least half a liter of water an hour to stay hydrated. Electrolyte drinks are also encouraged. You should be drinking enough fluids to make your urine light yellow, almost clear. If this is not the case, you are not drinking enough water. Please note that some of the treatments indicated below will not be effective if you are not adequately hydrated. Pain or fever: Ibuprofen, Naproxen, or Tylenol for pain or fever.  Nausea/vomiting: Use the Zofran for nausea or vomiting. You may need to stop breastfeeding (but keep pumping to prevent milk buildup) during the use of medications like Zofran. Consult your OBGYN or the pediatrician for questions. Congestion: Plain Mucinex may help relieve congestion. Saline sinus rinses and saline nasal sprays may also help relieve congestion. If you do not have heart problems or an allergy to such medications, you may also try phenylephrine or Sudafed. If she use Mucinex, phenylephrine, Sudafed, or other decongestants, please do not breast-feed during their use, or consult your OB/GYN. Sore throat: Warm liquids or Chloraseptic spray may help soothe a sore throat. Gargle twice a day with a salt water solution made from a half teaspoon of salt in a cup of warm water.  Follow up: Follow up with a primary care  provider, as needed, for any future management of this issue.

## 2017-06-08 NOTE — ED Notes (Signed)
Patient transported to X-ray 

## 2017-06-09 LAB — CULTURE, GROUP A STREP (THRC)

## 2019-01-09 IMAGING — DX DG CHEST 1V PORT
1 series · 1 of 1 positions shown · non-contrast
Comparison: None.

CLINICAL DATA: Short of breath. Recent Cesarean section 4 days ago.

EXAM:
PORTABLE CHEST 1 VIEW

[chest ap]
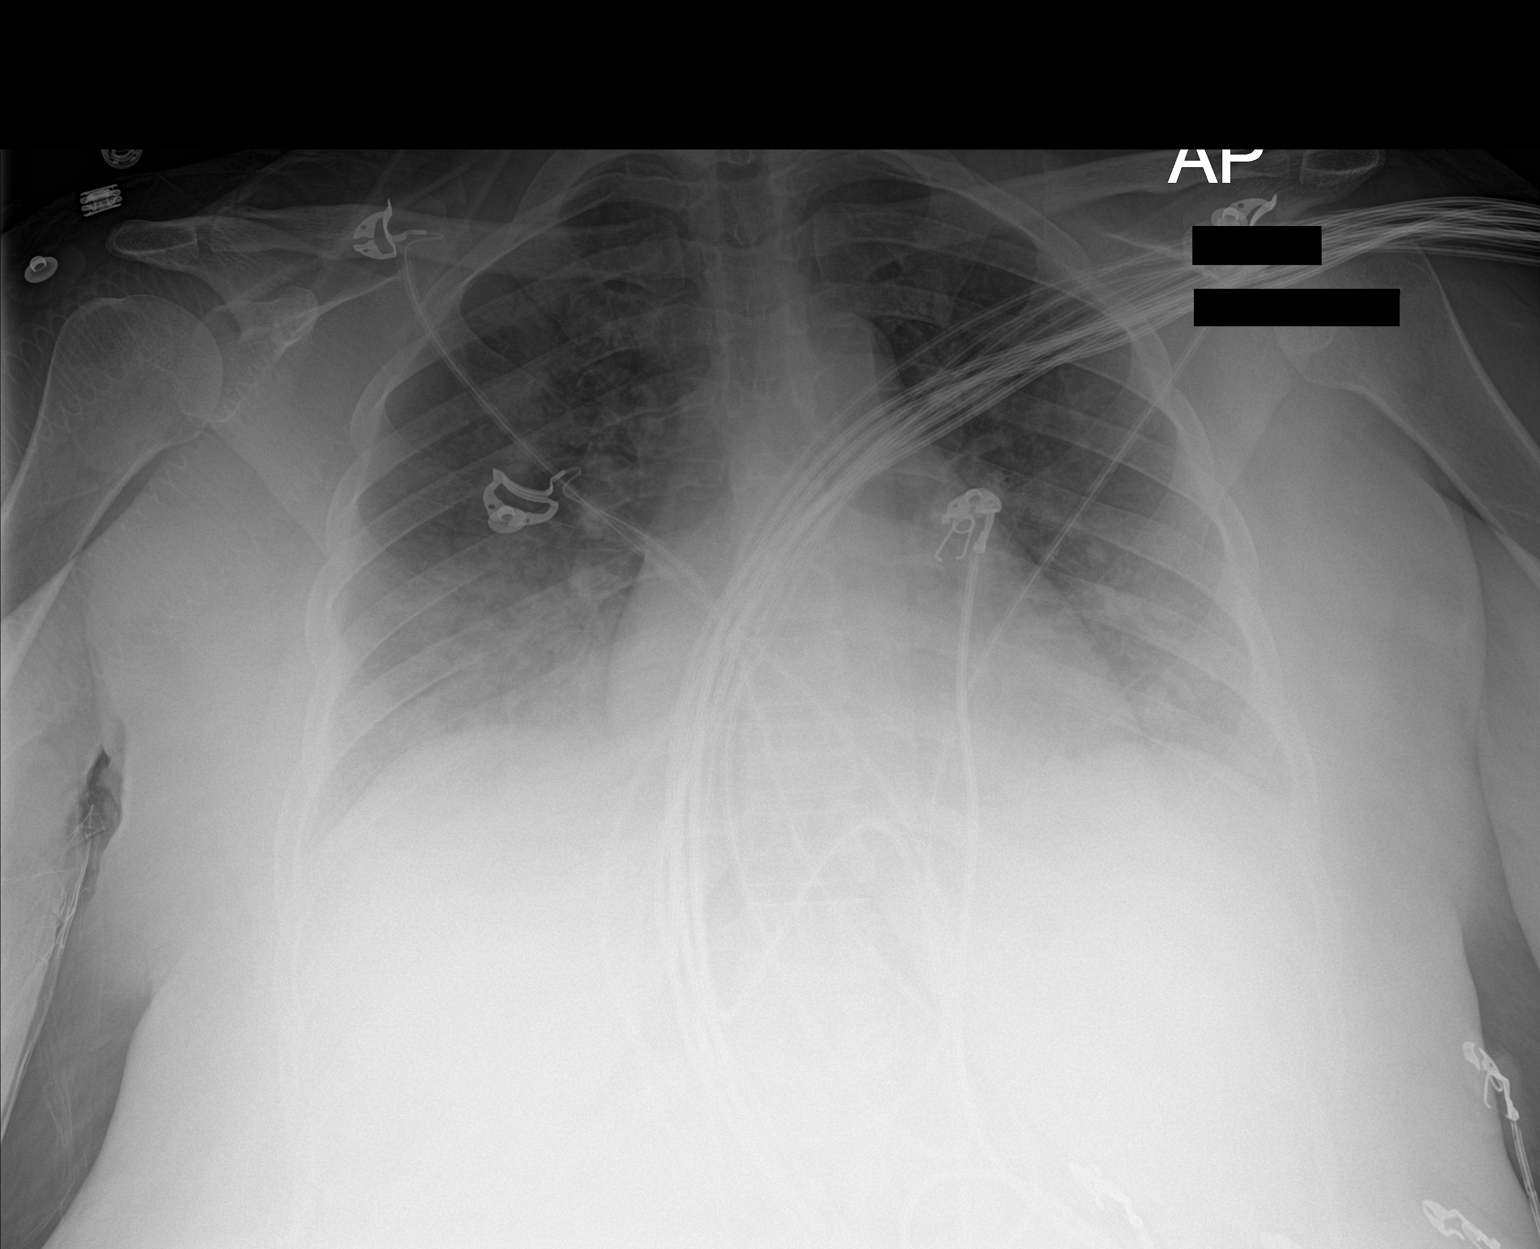

[1 of 1 positions shown; findings below may reference images not displayed]

FINDINGS: Mildly degraded exam due to AP portable technique and patient body
habitus. Midline trachea. Cardiomegaly accentuated by AP portable
technique. No definite pleural fluid. Probable lower lobe
predominant airspace disease, greater on the right. Concurrent mild
interstitial prominence.
IMPRESSION: No active disease.

## 2019-01-10 IMAGING — CR DG CHEST 2V
2 series · 2 of 2 positions shown · non-contrast
Comparison: 12/26/2016

CLINICAL DATA: Hypertension, preeclampsia/ postpartum

EXAM:
CHEST  2 VIEW

[chest pa]
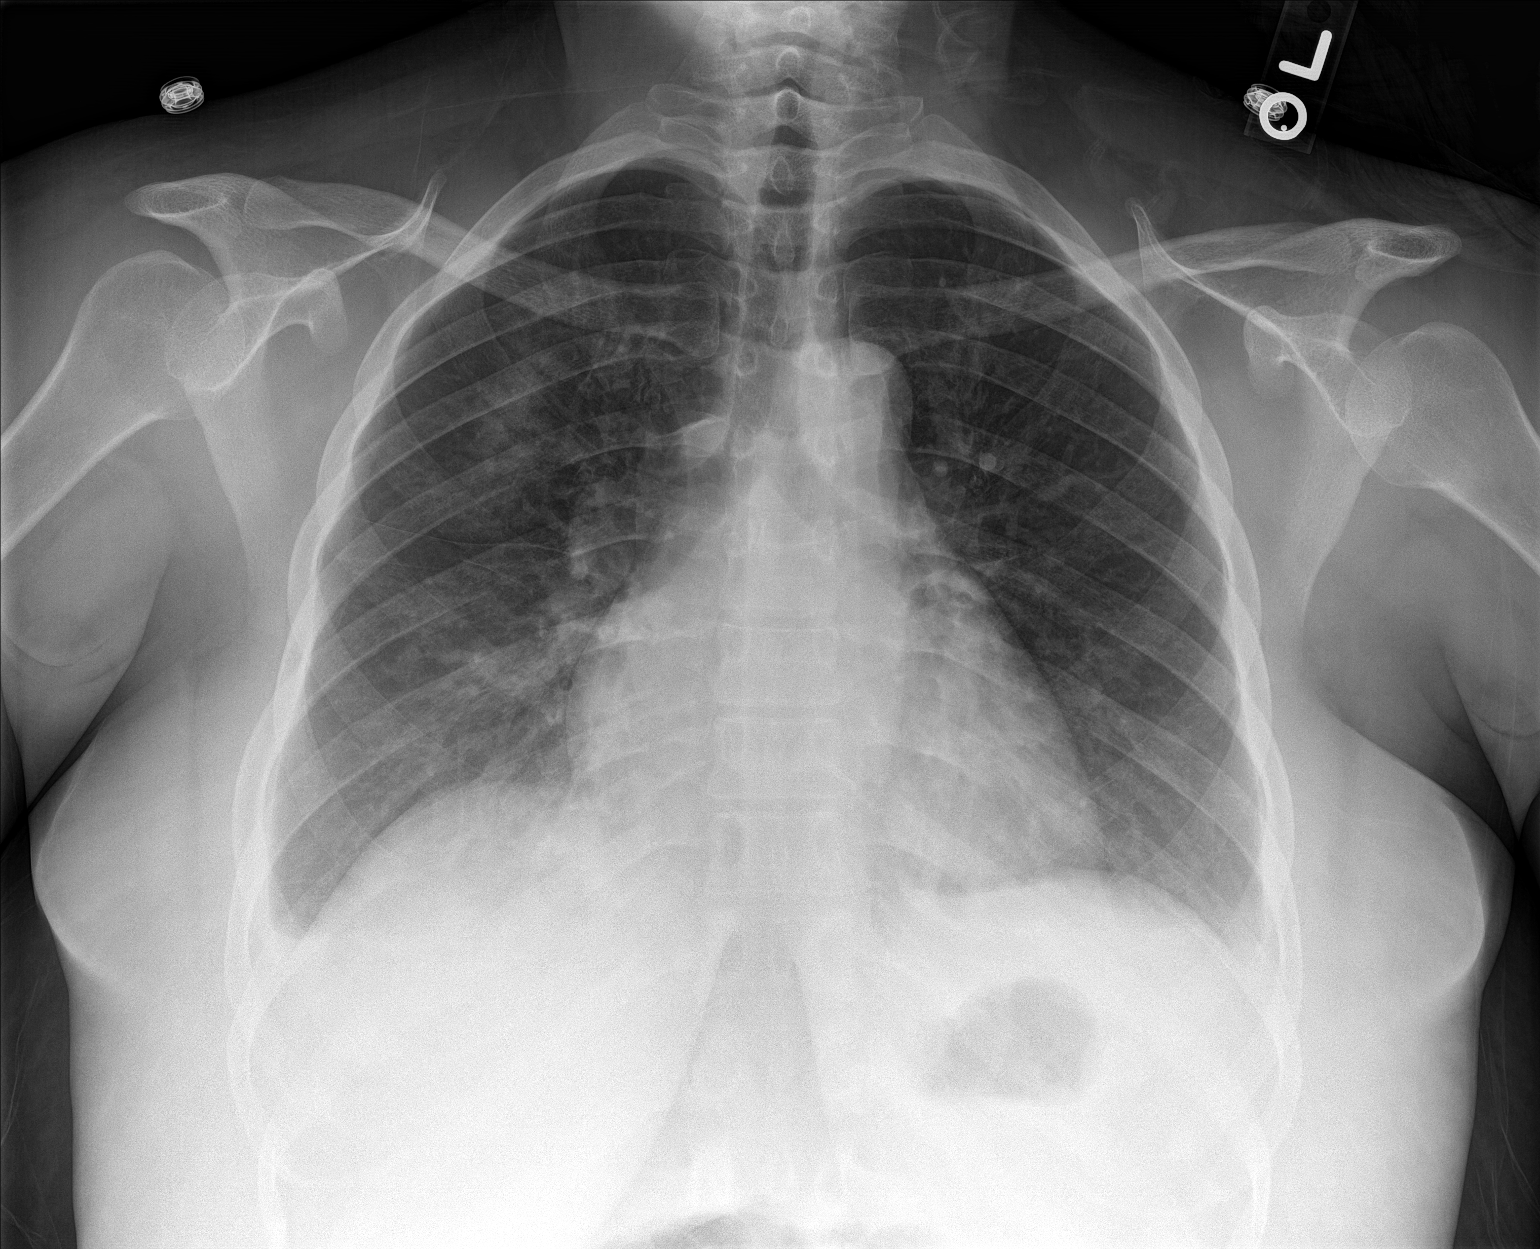

[chest lat]
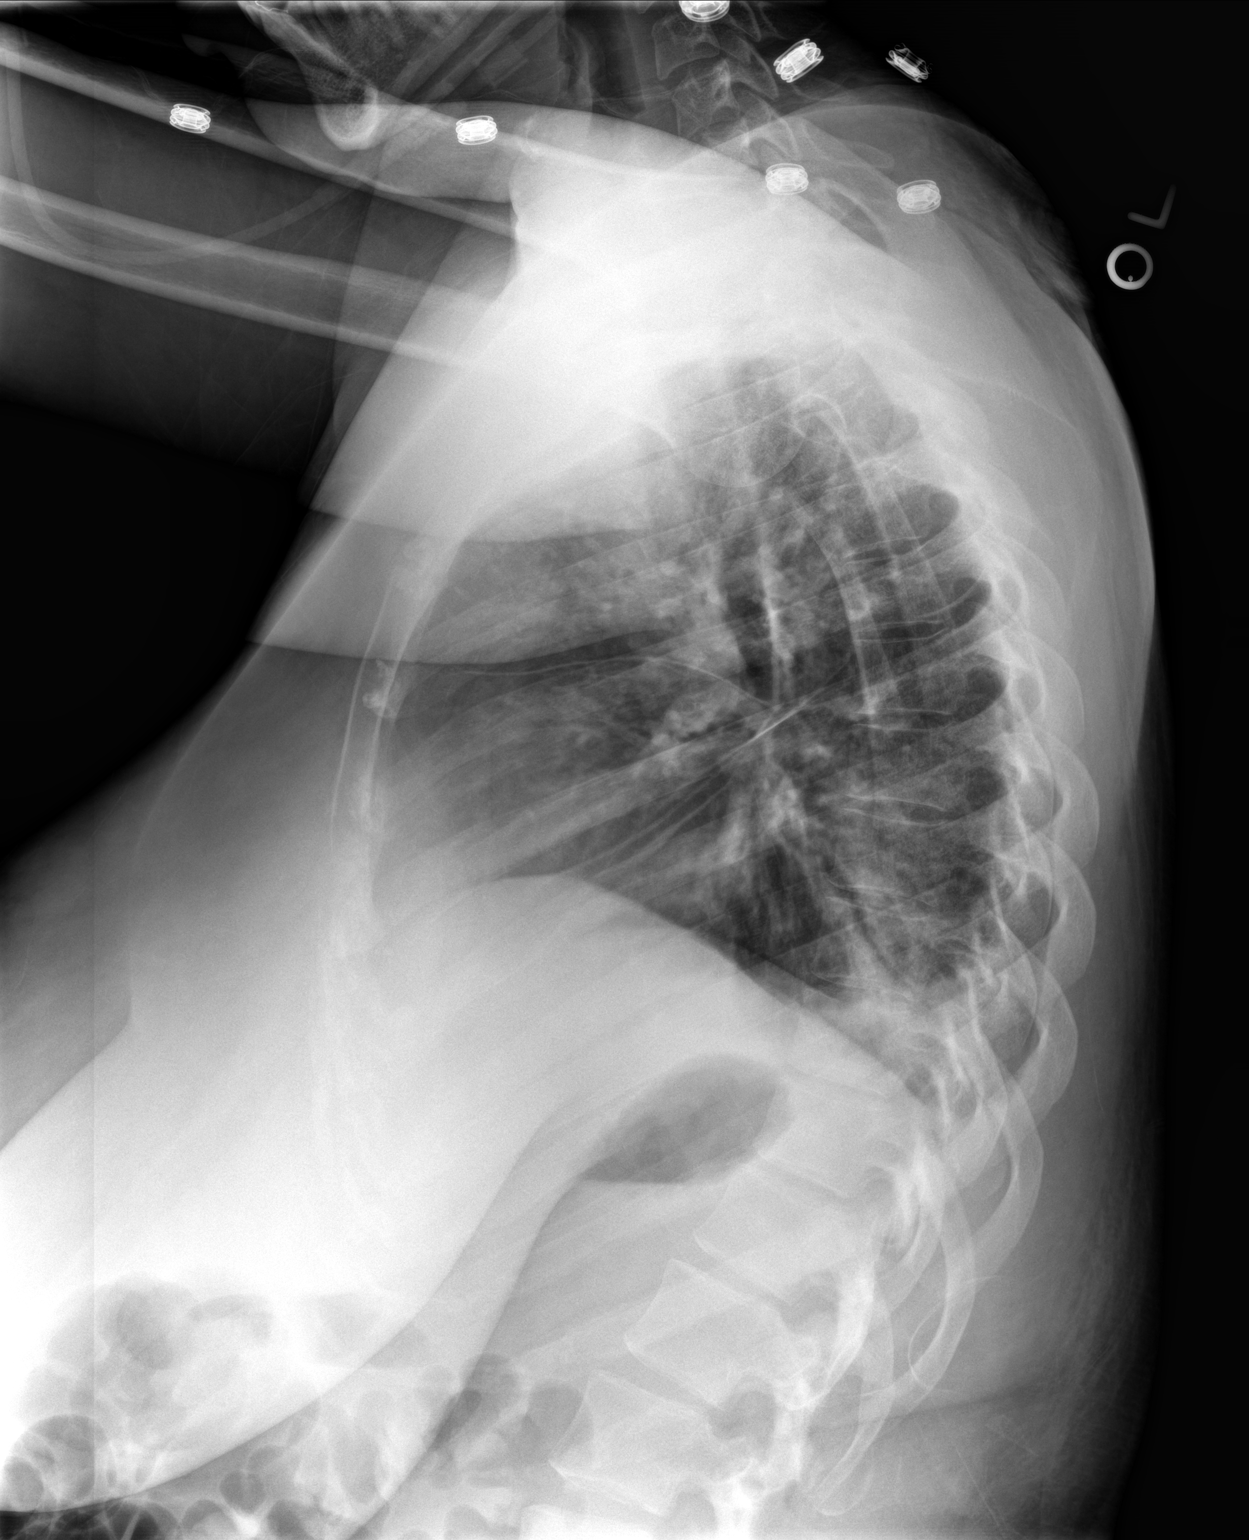

[2 of 2 positions shown; findings below may reference images not displayed]

FINDINGS: Cardiomediastinal silhouette is stable. There is improvement in
aeration. There is residual mild perihilar interstitial prominence.
Residual mild patchy airspace opacification right upper lobe and
right infrahilar region. Findings consistent with mild residual
interstitial edema less likely pneumonia. Significant improvement
from prior exam.
IMPRESSION: There is residual mild perihilar interstitial prominence. Residual
mild patchy airspace opacification right upper lobe and right
infrahilar region. Findings consistent with mild residual
interstitial edema less likely pneumonia. Significant improvement
from prior exam.

## 2022-11-03 ENCOUNTER — Other Ambulatory Visit (HOSPITAL_BASED_OUTPATIENT_CLINIC_OR_DEPARTMENT_OTHER): Payer: Self-pay

## 2022-11-03 MED ORDER — WEGOVY 0.25 MG/0.5ML ~~LOC~~ SOAJ
0.2500 mg | SUBCUTANEOUS | 0 refills | Status: AC
  Filled 2022-11-03: qty 2, 28d supply, fill #0

## 2022-11-05 ENCOUNTER — Other Ambulatory Visit (HOSPITAL_BASED_OUTPATIENT_CLINIC_OR_DEPARTMENT_OTHER): Payer: Self-pay

## 2022-11-11 ENCOUNTER — Other Ambulatory Visit (HOSPITAL_BASED_OUTPATIENT_CLINIC_OR_DEPARTMENT_OTHER): Payer: Self-pay

## 2022-11-12 ENCOUNTER — Other Ambulatory Visit (HOSPITAL_BASED_OUTPATIENT_CLINIC_OR_DEPARTMENT_OTHER): Payer: Self-pay

## 2022-11-12 ENCOUNTER — Other Ambulatory Visit: Payer: Self-pay
